# Patient Record
Sex: Female | Born: 1945 | Race: White | Hispanic: No | Marital: Married | Smoking: Former smoker
Health system: Southern US, Community
[De-identification: ages and names within clinical notes are randomized; demographics above are authoritative.]

## PROBLEM LIST (undated history)

## (undated) DIAGNOSIS — E785 Hyperlipidemia, unspecified: Secondary | ICD-10-CM

## (undated) DIAGNOSIS — I10 Essential (primary) hypertension: Secondary | ICD-10-CM

## (undated) HISTORY — PX: COLONOSCOPY: SHX174

## (undated) HISTORY — DX: Essential (primary) hypertension: I10

## (undated) HISTORY — PX: ECTOPIC PREGNANCY SURGERY: SHX613

## (undated) HISTORY — DX: Hyperlipidemia, unspecified: E78.5

## (undated) HISTORY — PX: BREAST REDUCTION SURGERY: SHX8

## (undated) HISTORY — PX: TOTAL ABDOMINAL HYSTERECTOMY: SHX209

---

## 1997-09-30 ENCOUNTER — Other Ambulatory Visit: Admission: RE | Admit: 1997-09-30 | Discharge: 1997-09-30 | Payer: Self-pay | Admitting: Obstetrics and Gynecology

## 1998-10-25 ENCOUNTER — Other Ambulatory Visit: Admission: RE | Admit: 1998-10-25 | Discharge: 1998-10-25 | Payer: Self-pay | Admitting: Obstetrics and Gynecology

## 1999-10-29 ENCOUNTER — Other Ambulatory Visit: Admission: RE | Admit: 1999-10-29 | Discharge: 1999-10-29 | Payer: Self-pay | Admitting: Obstetrics and Gynecology

## 2000-06-25 ENCOUNTER — Encounter (INDEPENDENT_AMBULATORY_CARE_PROVIDER_SITE_OTHER): Payer: Self-pay

## 2000-06-25 ENCOUNTER — Other Ambulatory Visit: Admission: RE | Admit: 2000-06-25 | Discharge: 2000-06-25 | Payer: Self-pay | Admitting: Obstetrics and Gynecology

## 2000-11-04 ENCOUNTER — Other Ambulatory Visit: Admission: RE | Admit: 2000-11-04 | Discharge: 2000-11-04 | Payer: Self-pay | Admitting: Obstetrics and Gynecology

## 2001-04-23 ENCOUNTER — Encounter (INDEPENDENT_AMBULATORY_CARE_PROVIDER_SITE_OTHER): Payer: Self-pay

## 2001-04-23 ENCOUNTER — Ambulatory Visit (HOSPITAL_COMMUNITY): Admission: RE | Admit: 2001-04-23 | Discharge: 2001-04-23 | Payer: Self-pay | Admitting: Obstetrics and Gynecology

## 2001-06-10 ENCOUNTER — Encounter (INDEPENDENT_AMBULATORY_CARE_PROVIDER_SITE_OTHER): Payer: Self-pay | Admitting: Specialist

## 2001-06-10 ENCOUNTER — Encounter: Payer: Self-pay | Admitting: Gastroenterology

## 2001-06-10 ENCOUNTER — Ambulatory Visit (HOSPITAL_COMMUNITY): Admission: RE | Admit: 2001-06-10 | Discharge: 2001-06-10 | Payer: Self-pay | Admitting: Gastroenterology

## 2001-11-06 ENCOUNTER — Other Ambulatory Visit: Admission: RE | Admit: 2001-11-06 | Discharge: 2001-11-06 | Payer: Self-pay | Admitting: Obstetrics and Gynecology

## 2002-07-16 ENCOUNTER — Encounter: Payer: Self-pay | Admitting: Obstetrics and Gynecology

## 2002-07-16 ENCOUNTER — Encounter: Payer: Self-pay | Admitting: Internal Medicine

## 2002-07-20 ENCOUNTER — Inpatient Hospital Stay (HOSPITAL_COMMUNITY): Admission: RE | Admit: 2002-07-20 | Discharge: 2002-07-23 | Payer: Self-pay | Admitting: Obstetrics and Gynecology

## 2002-07-20 ENCOUNTER — Encounter (INDEPENDENT_AMBULATORY_CARE_PROVIDER_SITE_OTHER): Payer: Self-pay | Admitting: Specialist

## 2003-03-14 ENCOUNTER — Other Ambulatory Visit: Admission: RE | Admit: 2003-03-14 | Discharge: 2003-03-14 | Payer: Self-pay | Admitting: Obstetrics and Gynecology

## 2003-10-03 ENCOUNTER — Inpatient Hospital Stay (HOSPITAL_BASED_OUTPATIENT_CLINIC_OR_DEPARTMENT_OTHER): Admission: RE | Admit: 2003-10-03 | Discharge: 2003-10-03 | Payer: Self-pay | Admitting: Cardiology

## 2003-10-10 ENCOUNTER — Encounter: Admission: RE | Admit: 2003-10-10 | Discharge: 2004-01-08 | Payer: Self-pay | Admitting: Internal Medicine

## 2003-12-19 ENCOUNTER — Ambulatory Visit: Payer: Self-pay | Admitting: Internal Medicine

## 2004-01-03 ENCOUNTER — Ambulatory Visit: Payer: Self-pay | Admitting: Internal Medicine

## 2004-03-16 ENCOUNTER — Ambulatory Visit: Payer: Self-pay | Admitting: Internal Medicine

## 2004-04-10 ENCOUNTER — Ambulatory Visit: Payer: Self-pay | Admitting: Internal Medicine

## 2004-04-11 ENCOUNTER — Ambulatory Visit: Payer: Self-pay | Admitting: Internal Medicine

## 2004-07-05 ENCOUNTER — Ambulatory Visit: Payer: Self-pay | Admitting: Internal Medicine

## 2004-07-11 ENCOUNTER — Ambulatory Visit: Payer: Self-pay | Admitting: Internal Medicine

## 2004-08-29 ENCOUNTER — Ambulatory Visit: Payer: Self-pay | Admitting: Endocrinology

## 2004-09-14 ENCOUNTER — Ambulatory Visit: Payer: Self-pay | Admitting: Gastroenterology

## 2004-10-03 ENCOUNTER — Ambulatory Visit: Payer: Self-pay | Admitting: Internal Medicine

## 2004-10-10 ENCOUNTER — Ambulatory Visit: Payer: Self-pay | Admitting: Internal Medicine

## 2004-10-23 ENCOUNTER — Ambulatory Visit: Payer: Self-pay | Admitting: Gastroenterology

## 2004-10-25 ENCOUNTER — Ambulatory Visit: Payer: Self-pay | Admitting: Internal Medicine

## 2004-12-10 ENCOUNTER — Ambulatory Visit: Payer: Self-pay | Admitting: Internal Medicine

## 2005-01-08 ENCOUNTER — Ambulatory Visit: Payer: Self-pay | Admitting: Internal Medicine

## 2005-06-10 ENCOUNTER — Ambulatory Visit: Payer: Self-pay | Admitting: Internal Medicine

## 2005-06-18 ENCOUNTER — Ambulatory Visit: Payer: Self-pay | Admitting: Internal Medicine

## 2005-07-01 ENCOUNTER — Ambulatory Visit: Payer: Self-pay | Admitting: Endocrinology

## 2005-08-22 ENCOUNTER — Ambulatory Visit: Payer: Self-pay | Admitting: Internal Medicine

## 2005-08-28 ENCOUNTER — Ambulatory Visit: Payer: Self-pay | Admitting: Internal Medicine

## 2005-09-20 ENCOUNTER — Ambulatory Visit: Payer: Self-pay | Admitting: Internal Medicine

## 2005-09-26 ENCOUNTER — Ambulatory Visit: Payer: Self-pay | Admitting: Internal Medicine

## 2005-10-21 ENCOUNTER — Ambulatory Visit: Payer: Self-pay | Admitting: Internal Medicine

## 2005-10-24 ENCOUNTER — Ambulatory Visit: Payer: Self-pay | Admitting: Gastroenterology

## 2005-12-10 ENCOUNTER — Ambulatory Visit (HOSPITAL_BASED_OUTPATIENT_CLINIC_OR_DEPARTMENT_OTHER): Admission: RE | Admit: 2005-12-10 | Discharge: 2005-12-11 | Payer: Self-pay | Admitting: Plastic Surgery

## 2005-12-10 ENCOUNTER — Encounter (INDEPENDENT_AMBULATORY_CARE_PROVIDER_SITE_OTHER): Payer: Self-pay | Admitting: *Deleted

## 2005-12-25 ENCOUNTER — Ambulatory Visit: Payer: Self-pay | Admitting: Internal Medicine

## 2005-12-25 LAB — CONVERTED CEMR LAB
Alkaline Phosphatase: 61 units/L (ref 39–117)
BUN: 11 mg/dL (ref 6–23)
CO2: 30 meq/L (ref 19–32)
Calcium: 9.8 mg/dL (ref 8.4–10.5)
Chloride: 106 meq/L (ref 96–112)
Chol/HDL Ratio, serum: 5
Creatinine, Ser: 0.8 mg/dL (ref 0.4–1.2)
GFR calc non Af Amer: 78 mL/min
Glucose, Bld: 114 mg/dL — ABNORMAL HIGH (ref 70–99)
HDL: 45 mg/dL (ref >39.0)
Hgb A1c MFr Bld: 5.1 % (ref 4.6–6.0)
LDL DIRECT: 121.2 mg/dL
Triglyceride fasting, serum: 333 mg/dL (ref 0–149)

## 2005-12-27 ENCOUNTER — Ambulatory Visit: Payer: Self-pay | Admitting: Internal Medicine

## 2006-01-07 ENCOUNTER — Ambulatory Visit: Payer: Self-pay | Admitting: Internal Medicine

## 2006-02-03 ENCOUNTER — Emergency Department (HOSPITAL_COMMUNITY): Admission: EM | Admit: 2006-02-03 | Discharge: 2006-02-03 | Payer: Self-pay | Admitting: Family Medicine

## 2006-03-15 ENCOUNTER — Ambulatory Visit: Payer: Self-pay | Admitting: Family Medicine

## 2006-04-09 ENCOUNTER — Emergency Department (HOSPITAL_COMMUNITY): Admission: EM | Admit: 2006-04-09 | Discharge: 2006-04-09 | Payer: Self-pay | Admitting: Emergency Medicine

## 2006-04-15 ENCOUNTER — Ambulatory Visit: Payer: Self-pay | Admitting: Internal Medicine

## 2006-04-15 LAB — CONVERTED CEMR LAB
Cholesterol: 211 mg/dL (ref 0–200)
Direct LDL: 116.4 mg/dL
Glucose, Bld: 109 mg/dL — ABNORMAL HIGH (ref 70–99)
HDL: 40.3 mg/dL (ref >39.0)
Hgb A1c MFr Bld: 6 % (ref 4.6–6.0)
Triglycerides: 398 mg/dL (ref 0–149)

## 2006-04-16 ENCOUNTER — Ambulatory Visit: Payer: Self-pay | Admitting: Internal Medicine

## 2006-05-16 ENCOUNTER — Emergency Department (HOSPITAL_COMMUNITY): Admission: EM | Admit: 2006-05-16 | Discharge: 2006-05-16 | Payer: Self-pay | Admitting: Family Medicine

## 2006-07-20 ENCOUNTER — Emergency Department (HOSPITAL_COMMUNITY): Admission: EM | Admit: 2006-07-20 | Discharge: 2006-07-20 | Payer: Self-pay | Admitting: Emergency Medicine

## 2006-08-19 ENCOUNTER — Ambulatory Visit: Payer: Self-pay | Admitting: Internal Medicine

## 2006-08-19 LAB — CONVERTED CEMR LAB
BUN: 13 mg/dL (ref 6–23)
Calcium: 9.8 mg/dL (ref 8.4–10.5)
Chloride: 107 meq/L (ref 96–112)
Creatinine, Ser: 0.7 mg/dL (ref 0.4–1.2)
GFR calc non Af Amer: 91 mL/min
Hgb A1c MFr Bld: 5.7 % (ref 4.6–6.0)
Sodium: 142 meq/L (ref 135–145)

## 2006-08-22 ENCOUNTER — Ambulatory Visit: Payer: Self-pay | Admitting: Internal Medicine

## 2006-08-22 LAB — CONVERTED CEMR LAB
Hemoglobin, Urine: NEGATIVE
Ketones, ur: NEGATIVE mg/dL
Leukocytes, UA: NEGATIVE
Specific Gravity, Urine: 1.01 (ref 1.000–1.03)
Total Protein, Urine: NEGATIVE mg/dL
Urobilinogen, UA: 0.2 (ref 0.0–1.0)
pH: 7 (ref 5.0–8.0)

## 2006-09-08 ENCOUNTER — Encounter: Payer: Self-pay | Admitting: Internal Medicine

## 2006-09-08 DIAGNOSIS — E119 Type 2 diabetes mellitus without complications: Secondary | ICD-10-CM

## 2006-09-08 DIAGNOSIS — D259 Leiomyoma of uterus, unspecified: Secondary | ICD-10-CM | POA: Insufficient documentation

## 2006-09-08 DIAGNOSIS — E785 Hyperlipidemia, unspecified: Secondary | ICD-10-CM | POA: Insufficient documentation

## 2006-09-08 DIAGNOSIS — Z860100 Personal history of colon polyps, unspecified: Secondary | ICD-10-CM | POA: Insufficient documentation

## 2006-09-08 DIAGNOSIS — M26629 Arthralgia of temporomandibular joint, unspecified side: Secondary | ICD-10-CM | POA: Insufficient documentation

## 2006-09-22 ENCOUNTER — Emergency Department (HOSPITAL_COMMUNITY): Admission: EM | Admit: 2006-09-22 | Discharge: 2006-09-22 | Payer: Self-pay | Admitting: Emergency Medicine

## 2006-10-20 ENCOUNTER — Encounter: Admission: RE | Admit: 2006-10-20 | Discharge: 2006-10-20 | Payer: Self-pay | Admitting: Obstetrics and Gynecology

## 2006-12-17 ENCOUNTER — Ambulatory Visit: Payer: Self-pay | Admitting: Cardiology

## 2006-12-17 LAB — CONVERTED CEMR LAB
BUN: 12 mg/dL (ref 6–23)
Basophils Absolute: 0 10*3/uL (ref 0.0–0.1)
Basophils Relative: 0 % (ref 0.0–1.0)
CO2: 29 meq/L (ref 19–32)
Chloride: 105 meq/L (ref 96–112)
Creatinine, Ser: 0.7 mg/dL (ref 0.4–1.2)
HCT: 41.1 % (ref 36.0–46.0)
Iron: 73 ug/dL (ref 42–145)
MCHC: 35.7 g/dL (ref 30.0–36.0)
Monocytes Relative: 3.5 % (ref 3.0–11.0)
Neutrophils Relative %: 71.1 % (ref 43.0–77.0)
RBC: 4.36 M/uL (ref 3.87–5.11)
RDW: 12.2 % (ref 11.5–14.6)

## 2006-12-23 ENCOUNTER — Ambulatory Visit: Payer: Self-pay | Admitting: Internal Medicine

## 2006-12-23 DIAGNOSIS — J309 Allergic rhinitis, unspecified: Secondary | ICD-10-CM | POA: Insufficient documentation

## 2007-01-22 ENCOUNTER — Ambulatory Visit: Payer: Self-pay | Admitting: Cardiology

## 2007-02-06 IMAGING — US US ABDOMEN COMPLETE
1 series · 14 of 25 positions shown · non-contrast
Comparison: none

[Series 1: us abdomen complete · 0.13mm/px · 14 of 66 slices shown]
[im 1/66]
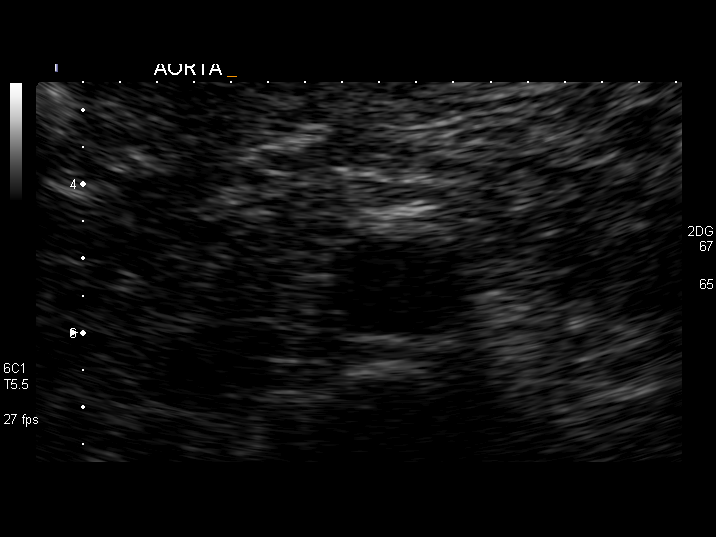
[im 6/66]
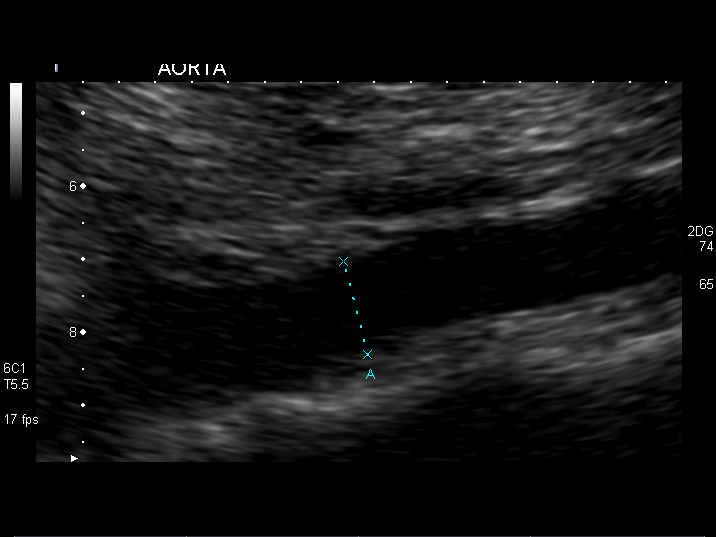
[im 11/66]
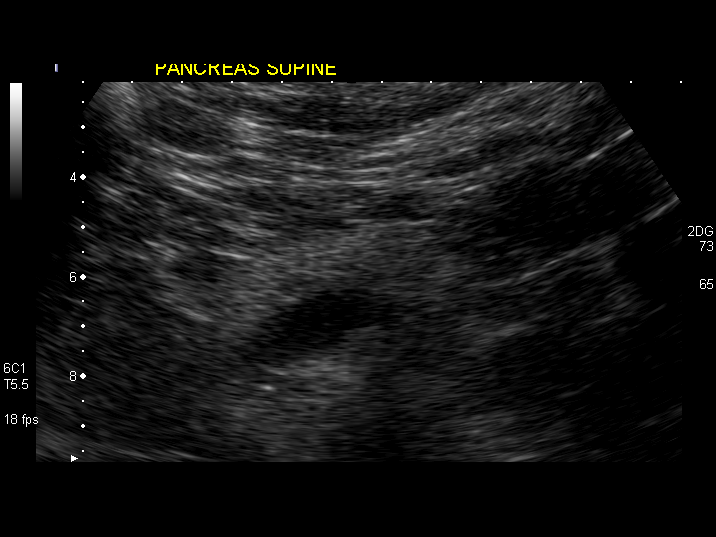
[im 17/66]
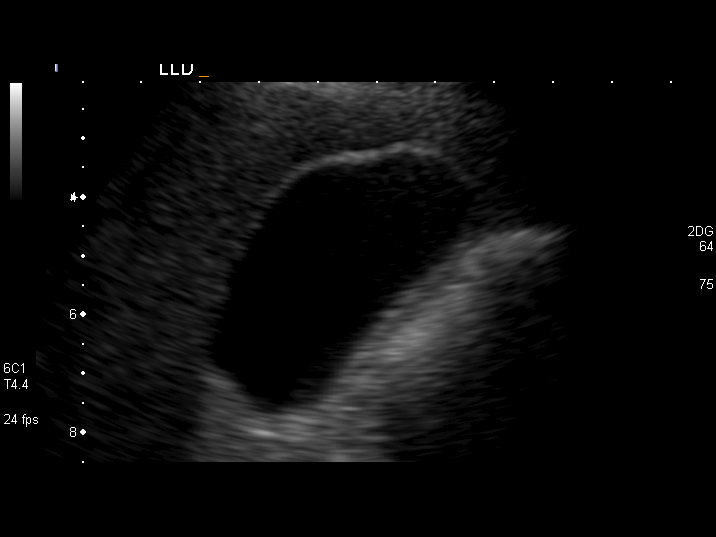
[im 22/66]
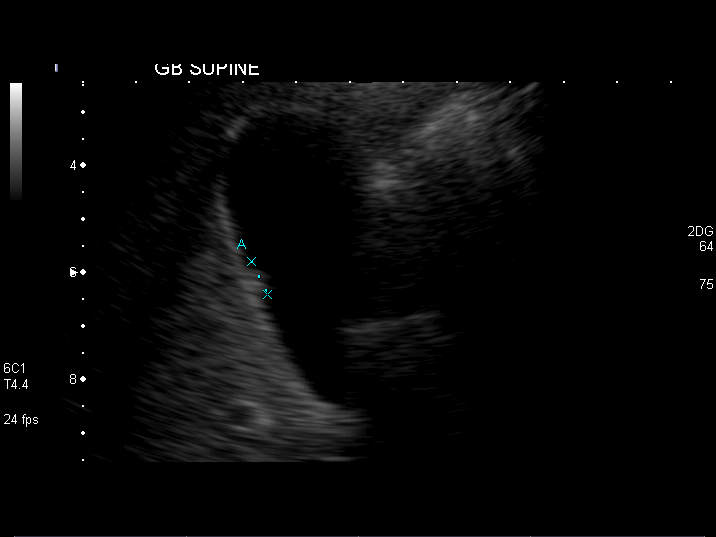
[im 25/66]
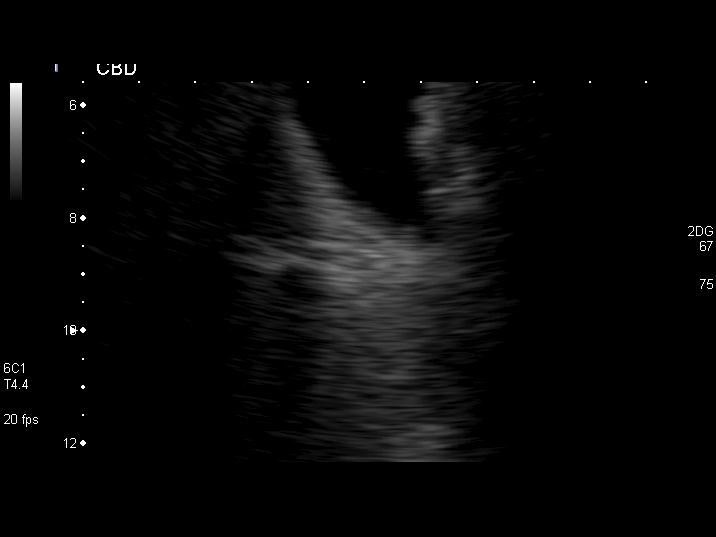
[im 30/66]
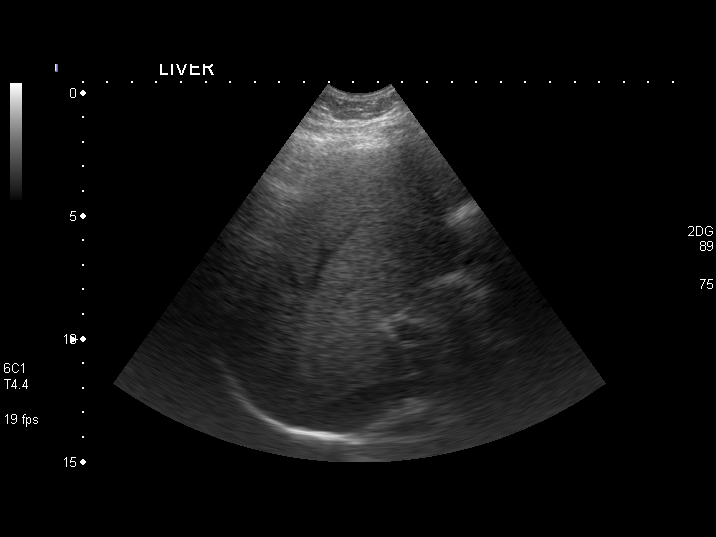
[im 36/66]
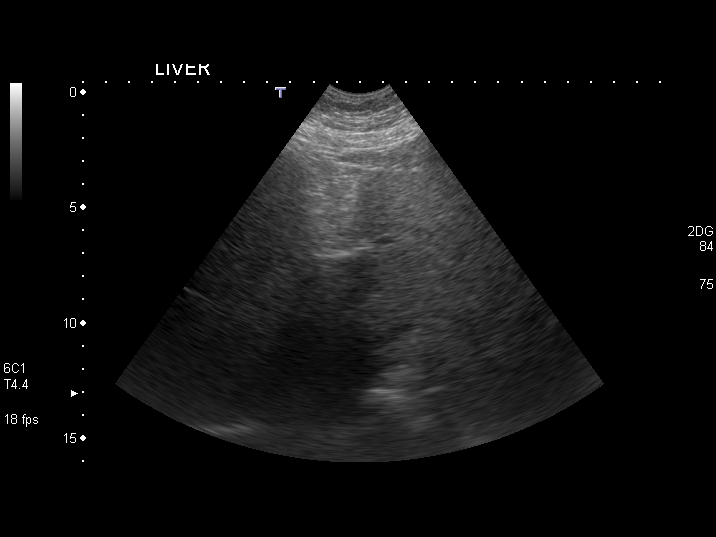
[im 41/66]
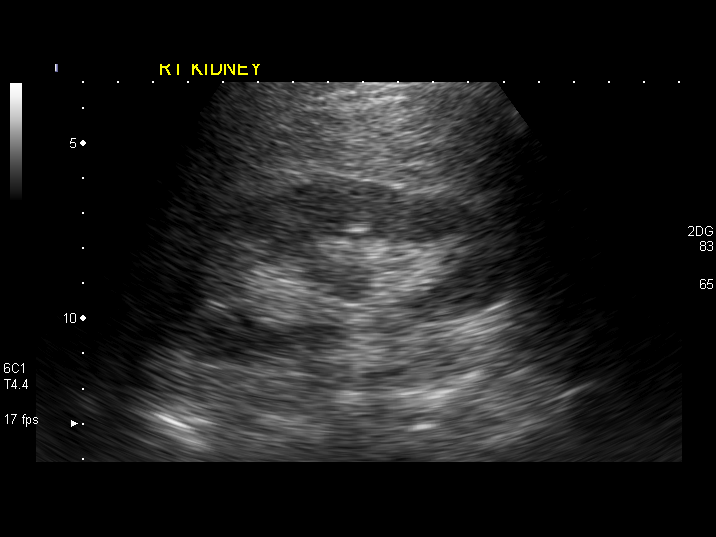
[im 44/66]
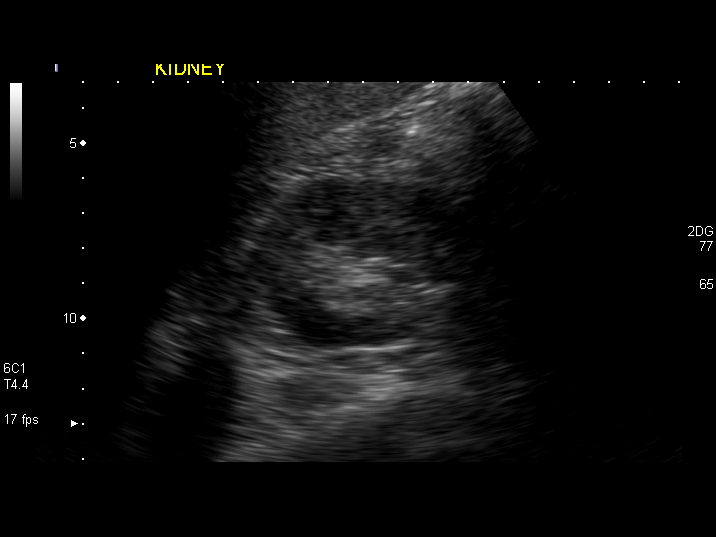
[im 49/66]
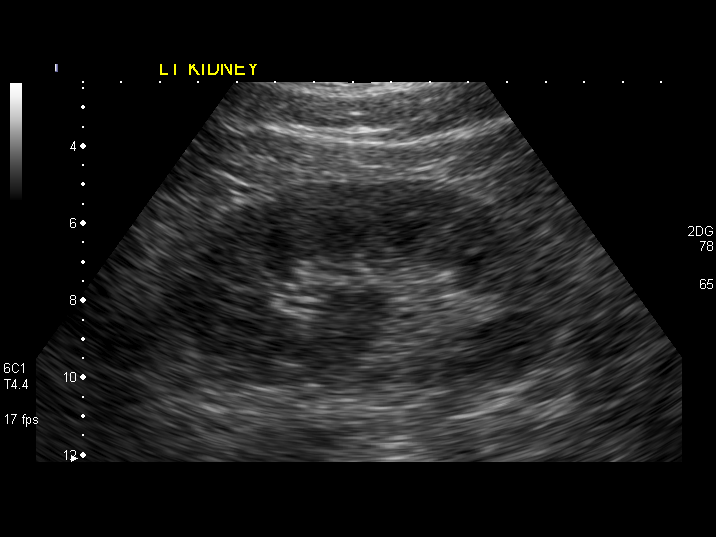
[im 55/66]
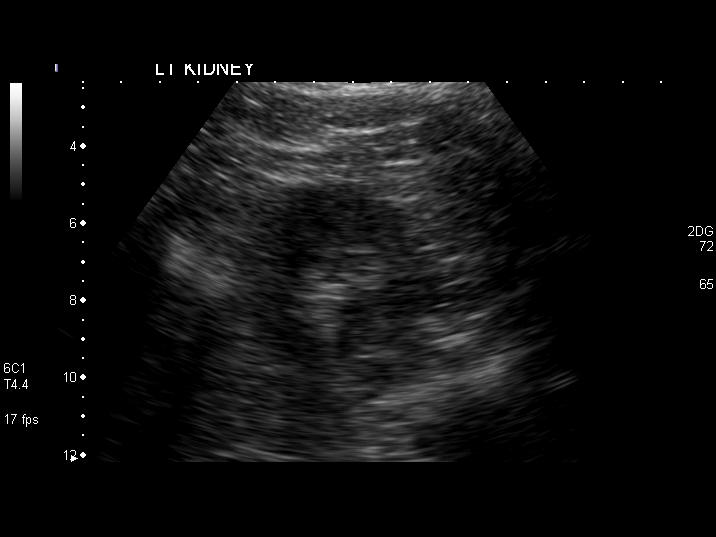
[im 60/66]
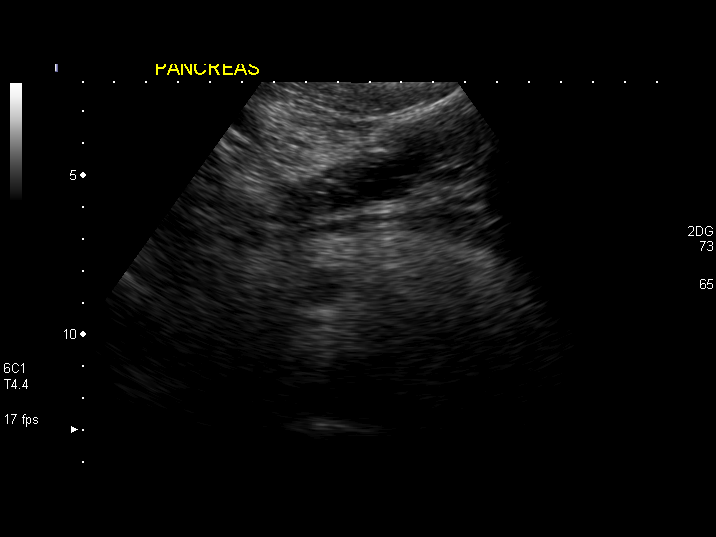
[im 66/66]
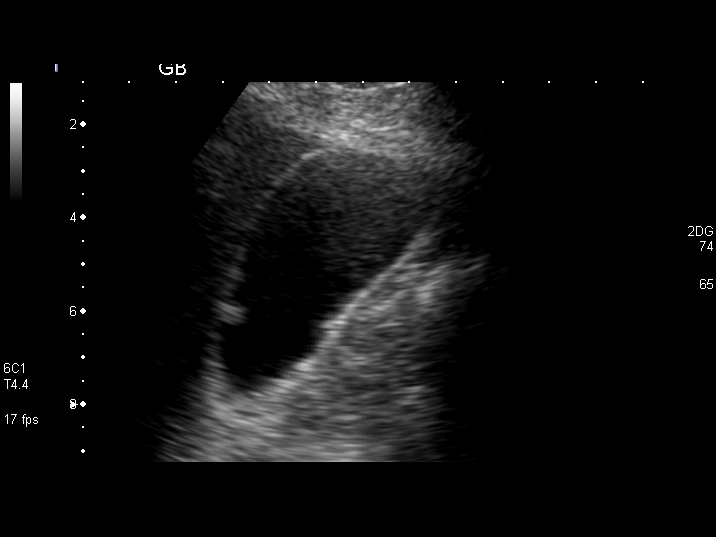

[14 of 25 positions shown; findings below may reference images not displayed]

ACCESSION # [id number]

 RESULTS:  Abdominal aorta is normal, maximal diameter 1.7 cm. The IVC is patent. 

 Pancreas normal. 

 Gallbladder generally appears normal without thickened walls or evidence of gallstones. There are several small 5-7 mm non shadowing stable polypoid lesions noted. Common duct normal at 3.8 mm. 

 Liver normal. 

 Kidneys normal, right 9.5, left 10.7 cm. Spleen normal at 10.7 cm.

 ASSESSMENT:  This is a normal upper abdominal ultrasound exam except for the small gallbladder polyps. There is no evidence of cholelithiasis or biliary ductal dilatation. The pancreas, kidneys and liver appear normal without abnormalities.

## 2007-02-15 ENCOUNTER — Emergency Department (HOSPITAL_COMMUNITY): Admission: EM | Admit: 2007-02-15 | Discharge: 2007-02-15 | Payer: Self-pay | Admitting: Emergency Medicine

## 2007-04-03 ENCOUNTER — Ambulatory Visit: Payer: Self-pay | Admitting: Internal Medicine

## 2007-04-03 LAB — CONVERTED CEMR LAB
Albumin: 4 g/dL (ref 3.5–5.2)
Alkaline Phosphatase: 55 units/L (ref 39–117)
BUN: 11 mg/dL (ref 6–23)
Basophils Absolute: 0 10*3/uL (ref 0.0–0.1)
Calcium: 10.1 mg/dL (ref 8.4–10.5)
Chloride: 108 meq/L (ref 96–112)
Cholesterol: 205 mg/dL (ref 0–200)
Creatinine, Ser: 0.8 mg/dL (ref 0.4–1.2)
Direct LDL: 128 mg/dL
HDL: 40.1 mg/dL (ref >39.0)
MCHC: 33.4 g/dL (ref 30.0–36.0)
Monocytes Absolute: 0.4 10*3/uL (ref 0.2–0.7)
Monocytes Relative: 8.2 % (ref 3.0–11.0)
Platelets: 283 10*3/uL (ref 150–400)
Potassium: 4.9 meq/L (ref 3.5–5.1)
RBC: 4.74 M/uL (ref 3.87–5.11)
RDW: 12.5 % (ref 11.5–14.6)
Total Bilirubin: 0.9 mg/dL (ref 0.3–1.2)
Total CHOL/HDL Ratio: 5.1
Triglycerides: 235 mg/dL (ref 0–149)

## 2007-04-09 ENCOUNTER — Emergency Department (HOSPITAL_COMMUNITY): Admission: EM | Admit: 2007-04-09 | Discharge: 2007-04-09 | Payer: Self-pay | Admitting: Family Medicine

## 2007-04-22 ENCOUNTER — Ambulatory Visit: Payer: Self-pay | Admitting: Internal Medicine

## 2007-06-18 ENCOUNTER — Emergency Department (HOSPITAL_COMMUNITY): Admission: EM | Admit: 2007-06-18 | Discharge: 2007-06-18 | Payer: Self-pay | Admitting: Emergency Medicine

## 2007-07-02 ENCOUNTER — Ambulatory Visit: Payer: Self-pay | Admitting: Internal Medicine

## 2007-07-02 DIAGNOSIS — J019 Acute sinusitis, unspecified: Secondary | ICD-10-CM

## 2007-08-19 ENCOUNTER — Ambulatory Visit: Payer: Self-pay | Admitting: Internal Medicine

## 2007-08-19 LAB — CONVERTED CEMR LAB
Albumin: 4.1 g/dL (ref 3.5–5.2)
BUN: 12 mg/dL (ref 6–23)
Bilirubin, Direct: 0.1 mg/dL (ref 0.0–0.3)
Calcium: 9.7 mg/dL (ref 8.4–10.5)
Direct LDL: 115 mg/dL
GFR calc Af Amer: 94 mL/min
Glucose, Bld: 122 mg/dL — ABNORMAL HIGH (ref 70–99)
HDL: 40.6 mg/dL (ref >39.0)
Sodium: 142 meq/L (ref 135–145)
Total Protein: 7.1 g/dL (ref 6.0–8.3)
Triglycerides: 202 mg/dL (ref 0–149)

## 2007-08-25 ENCOUNTER — Ambulatory Visit: Payer: Self-pay | Admitting: Internal Medicine

## 2007-08-25 DIAGNOSIS — IMO0001 Reserved for inherently not codable concepts without codable children: Secondary | ICD-10-CM | POA: Insufficient documentation

## 2007-10-21 ENCOUNTER — Encounter: Admission: RE | Admit: 2007-10-21 | Discharge: 2007-10-21 | Payer: Self-pay | Admitting: Internal Medicine

## 2007-10-23 ENCOUNTER — Encounter: Admission: RE | Admit: 2007-10-23 | Discharge: 2007-10-23 | Payer: Self-pay | Admitting: Internal Medicine

## 2007-10-28 ENCOUNTER — Ambulatory Visit: Payer: Self-pay | Admitting: Family Medicine

## 2007-12-10 ENCOUNTER — Ambulatory Visit: Payer: Self-pay | Admitting: Internal Medicine

## 2007-12-10 DIAGNOSIS — L923 Foreign body granuloma of the skin and subcutaneous tissue: Secondary | ICD-10-CM | POA: Insufficient documentation

## 2007-12-17 ENCOUNTER — Encounter: Payer: Self-pay | Admitting: Internal Medicine

## 2007-12-21 ENCOUNTER — Encounter: Admission: RE | Admit: 2007-12-21 | Discharge: 2007-12-21 | Payer: Self-pay | Admitting: Orthopedic Surgery

## 2007-12-22 ENCOUNTER — Ambulatory Visit: Payer: Self-pay | Admitting: Internal Medicine

## 2007-12-22 LAB — CONVERTED CEMR LAB
ALT: 22 units/L (ref 0–35)
BUN: 12 mg/dL (ref 6–23)
CO2: 28 meq/L (ref 19–32)
Calcium: 9.8 mg/dL (ref 8.4–10.5)
Cholesterol: 273 mg/dL (ref 0–200)
Creatinine, Ser: 0.7 mg/dL (ref 0.4–1.2)
Direct LDL: 142.9 mg/dL
Glucose, Bld: 118 mg/dL — ABNORMAL HIGH (ref 70–99)
Hgb A1c MFr Bld: 5.7 % (ref 4.6–6.0)
Total Protein: 6.8 g/dL (ref 6.0–8.3)
Triglycerides: 425 mg/dL (ref 0–149)

## 2007-12-29 ENCOUNTER — Encounter: Payer: Self-pay | Admitting: Internal Medicine

## 2008-01-14 ENCOUNTER — Encounter (INDEPENDENT_AMBULATORY_CARE_PROVIDER_SITE_OTHER): Payer: Self-pay | Admitting: Orthopedic Surgery

## 2008-01-14 ENCOUNTER — Ambulatory Visit (HOSPITAL_BASED_OUTPATIENT_CLINIC_OR_DEPARTMENT_OTHER): Admission: RE | Admit: 2008-01-14 | Discharge: 2008-01-14 | Payer: Self-pay | Admitting: Orthopedic Surgery

## 2008-01-22 ENCOUNTER — Encounter: Payer: Self-pay | Admitting: Internal Medicine

## 2008-01-28 ENCOUNTER — Encounter: Payer: Self-pay | Admitting: Internal Medicine

## 2008-02-23 ENCOUNTER — Encounter: Payer: Self-pay | Admitting: Internal Medicine

## 2008-02-24 ENCOUNTER — Emergency Department (HOSPITAL_COMMUNITY): Admission: EM | Admit: 2008-02-24 | Discharge: 2008-02-24 | Payer: Self-pay | Admitting: Family Medicine

## 2008-04-05 ENCOUNTER — Telehealth (INDEPENDENT_AMBULATORY_CARE_PROVIDER_SITE_OTHER): Payer: Self-pay | Admitting: *Deleted

## 2008-04-18 ENCOUNTER — Ambulatory Visit: Payer: Self-pay | Admitting: Internal Medicine

## 2008-04-18 LAB — CONVERTED CEMR LAB
Alkaline Phosphatase: 62 units/L (ref 39–117)
Bilirubin, Direct: 0.1 mg/dL (ref 0.0–0.3)
CO2: 28 meq/L (ref 19–32)
GFR calc Af Amer: 93 mL/min
Glucose, Bld: 126 mg/dL — ABNORMAL HIGH (ref 70–99)
Potassium: 4.1 meq/L (ref 3.5–5.1)
Sodium: 143 meq/L (ref 135–145)
Total Bilirubin: 0.9 mg/dL (ref 0.3–1.2)
Total CHOL/HDL Ratio: 4.5
Total Protein: 6.3 g/dL (ref 6.0–8.3)
VLDL: 38 mg/dL (ref 0–40)

## 2008-04-21 ENCOUNTER — Emergency Department (HOSPITAL_COMMUNITY): Admission: EM | Admit: 2008-04-21 | Discharge: 2008-04-21 | Payer: Self-pay | Admitting: Family Medicine

## 2008-04-25 ENCOUNTER — Ambulatory Visit: Payer: Self-pay | Admitting: Internal Medicine

## 2008-05-24 ENCOUNTER — Encounter: Admission: RE | Admit: 2008-05-24 | Discharge: 2008-05-24 | Payer: Self-pay | Admitting: Internal Medicine

## 2008-06-01 ENCOUNTER — Telehealth: Payer: Self-pay | Admitting: Internal Medicine

## 2008-07-20 ENCOUNTER — Emergency Department (HOSPITAL_COMMUNITY): Admission: EM | Admit: 2008-07-20 | Discharge: 2008-07-20 | Payer: Self-pay | Admitting: Family Medicine

## 2008-08-16 ENCOUNTER — Ambulatory Visit: Payer: Self-pay | Admitting: Internal Medicine

## 2008-08-17 LAB — CONVERTED CEMR LAB
CO2: 30 meq/L (ref 19–32)
Calcium: 9.6 mg/dL (ref 8.4–10.5)
Chloride: 105 meq/L (ref 96–112)
Direct LDL: 111.7 mg/dL
Glucose, Bld: 117 mg/dL — ABNORMAL HIGH (ref 70–99)
HDL: 41.4 mg/dL (ref >39.00)
Hgb A1c MFr Bld: 5.8 % (ref 4.6–6.5)
Sodium: 142 meq/L (ref 135–145)
Total CHOL/HDL Ratio: 4
Triglycerides: 256 mg/dL — ABNORMAL HIGH (ref 0.0–149.0)
VLDL: 51.2 mg/dL — ABNORMAL HIGH (ref 0.0–40.0)

## 2008-08-23 ENCOUNTER — Ambulatory Visit: Payer: Self-pay | Admitting: Internal Medicine

## 2008-09-12 ENCOUNTER — Emergency Department (HOSPITAL_COMMUNITY): Admission: EM | Admit: 2008-09-12 | Discharge: 2008-09-12 | Payer: Self-pay | Admitting: Emergency Medicine

## 2008-10-21 ENCOUNTER — Encounter: Admission: RE | Admit: 2008-10-21 | Discharge: 2008-10-21 | Payer: Self-pay | Admitting: Internal Medicine

## 2008-10-28 ENCOUNTER — Telehealth: Payer: Self-pay | Admitting: Internal Medicine

## 2008-11-02 ENCOUNTER — Encounter: Payer: Self-pay | Admitting: Internal Medicine

## 2008-11-02 ENCOUNTER — Ambulatory Visit: Payer: Self-pay | Admitting: Internal Medicine

## 2008-11-02 DIAGNOSIS — N390 Urinary tract infection, site not specified: Secondary | ICD-10-CM | POA: Insufficient documentation

## 2008-11-02 DIAGNOSIS — R109 Unspecified abdominal pain: Secondary | ICD-10-CM | POA: Insufficient documentation

## 2008-11-02 LAB — CONVERTED CEMR LAB
Bilirubin Urine: NEGATIVE
Ketones, urine, test strip: NEGATIVE
Nitrite: NEGATIVE
Specific Gravity, Urine: <1.005
Urobilinogen, UA: 0.2
pH: 5

## 2008-11-03 ENCOUNTER — Ambulatory Visit: Payer: Self-pay | Admitting: Cardiology

## 2008-11-03 ENCOUNTER — Telehealth: Payer: Self-pay | Admitting: Internal Medicine

## 2008-11-03 DIAGNOSIS — N816 Rectocele: Secondary | ICD-10-CM

## 2008-11-03 DIAGNOSIS — N8111 Cystocele, midline: Secondary | ICD-10-CM

## 2008-11-18 ENCOUNTER — Ambulatory Visit: Payer: Self-pay | Admitting: Internal Medicine

## 2008-11-18 DIAGNOSIS — I10 Essential (primary) hypertension: Secondary | ICD-10-CM

## 2008-11-18 DIAGNOSIS — R0789 Other chest pain: Secondary | ICD-10-CM

## 2008-12-21 ENCOUNTER — Ambulatory Visit: Payer: Self-pay | Admitting: Internal Medicine

## 2008-12-21 LAB — CONVERTED CEMR LAB
AST: 18 units/L (ref 0–37)
Albumin: 4.1 g/dL (ref 3.5–5.2)
Alkaline Phosphatase: 67 units/L (ref 39–117)
BUN: 11 mg/dL (ref 6–23)
Bilirubin, Direct: 0.1 mg/dL (ref 0.0–0.3)
Calcium: 9.4 mg/dL (ref 8.4–10.5)
Cholesterol: 205 mg/dL — ABNORMAL HIGH (ref 0–200)
Creatinine, Ser: 0.7 mg/dL (ref 0.4–1.2)
GFR calc non Af Amer: 89.89 mL/min (ref >60)
Glucose, Bld: 115 mg/dL — ABNORMAL HIGH (ref 70–99)
Total Protein: 7.2 g/dL (ref 6.0–8.3)

## 2008-12-27 ENCOUNTER — Ambulatory Visit: Payer: Self-pay | Admitting: Internal Medicine

## 2009-01-17 ENCOUNTER — Telehealth: Payer: Self-pay | Admitting: Internal Medicine

## 2009-02-13 ENCOUNTER — Telehealth: Payer: Self-pay | Admitting: Internal Medicine

## 2009-04-04 IMAGING — US US MISC SOFT TISSUE
1 series · 10 of 10 positions shown · non-contrast
Comparison: Radiographs accompanying the patient from Dr. [REDACTED].

CLINICAL DATA: Laceration to the tip of the index finger.  Question
glass foreign body.

SOFT TISSUE ULTRASOUND - MISCELLANEOUS
Ultrasound evaluation of the distal right index finger was
performed.

[Series 1: us misc soft tissue · 0.05mm/px · 10 of 10 slices shown]
[im 1/10]
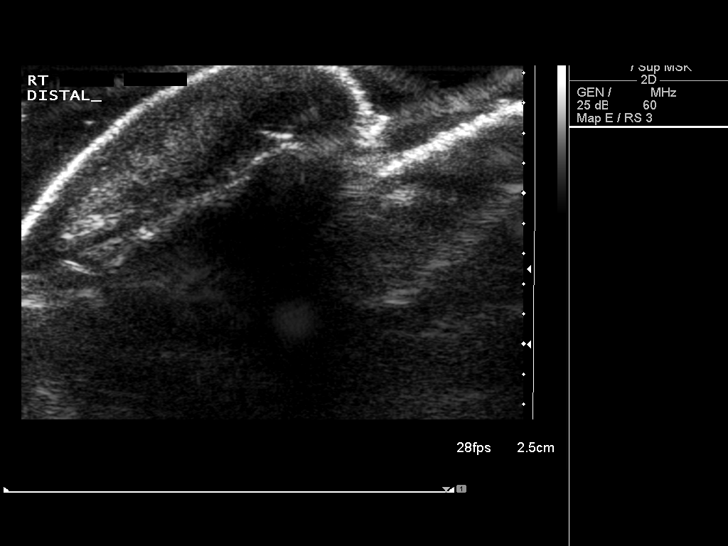
[im 2/10]
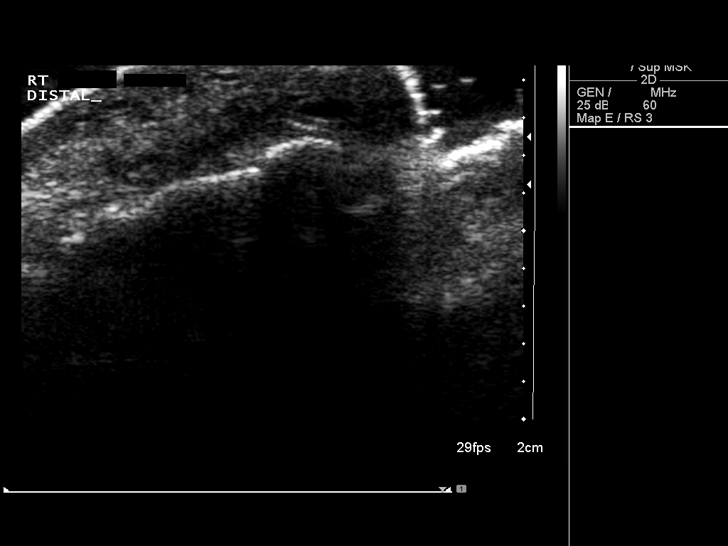
[im 3/10]
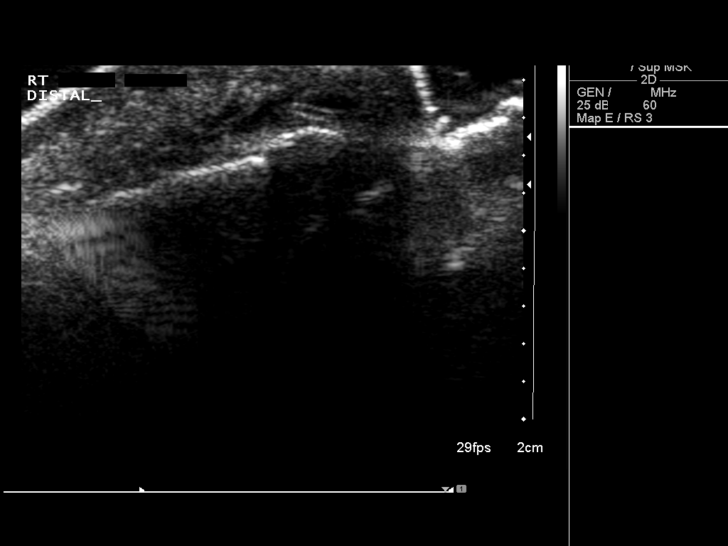
[im 4/10]
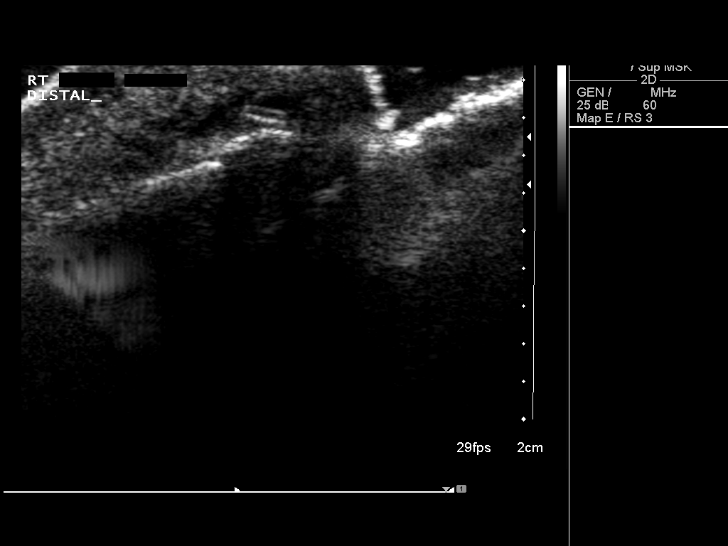
[im 5/10]
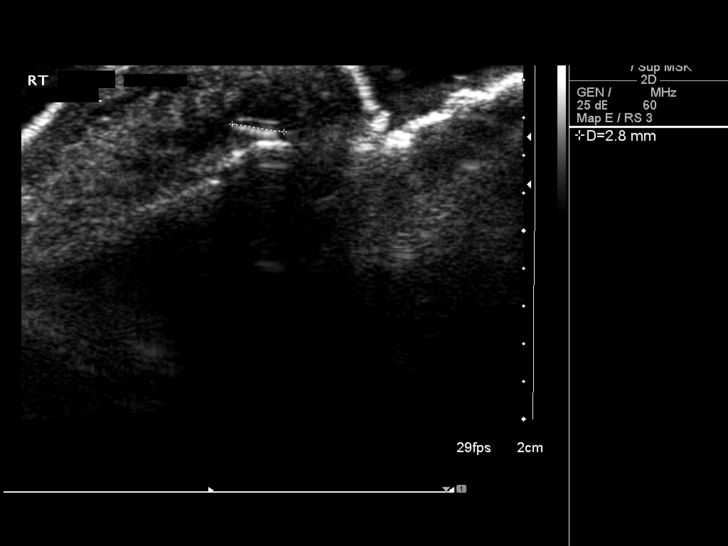
[im 6/10]
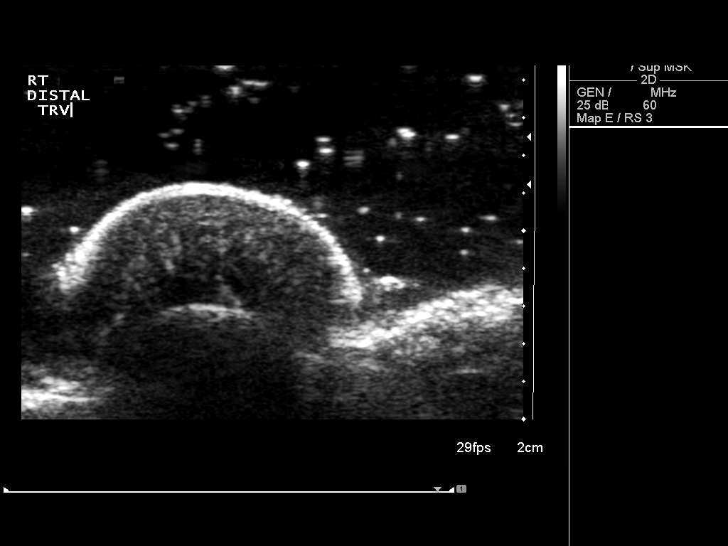
[im 7/10]
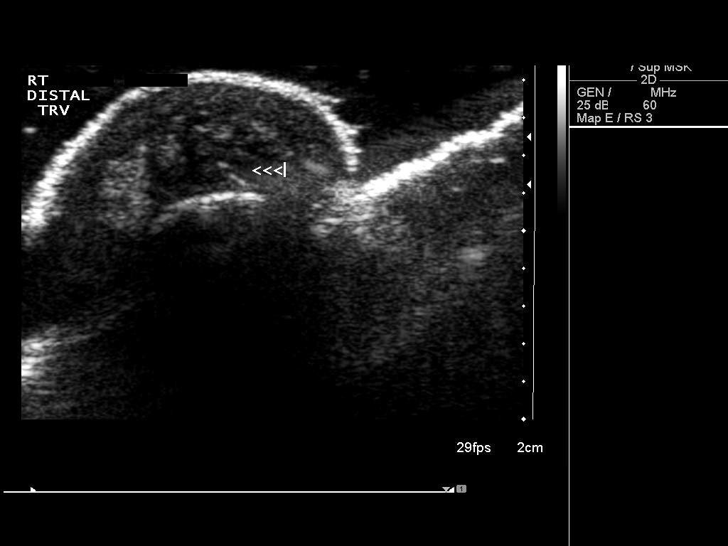
[im 8/10]
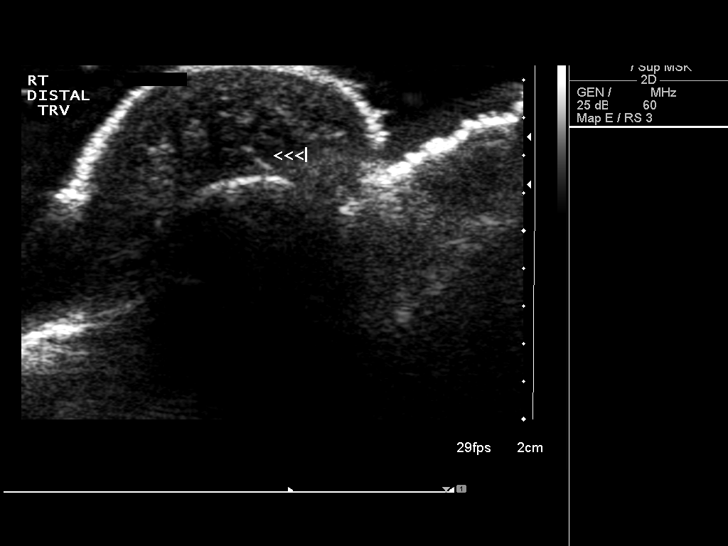
[im 9/10]
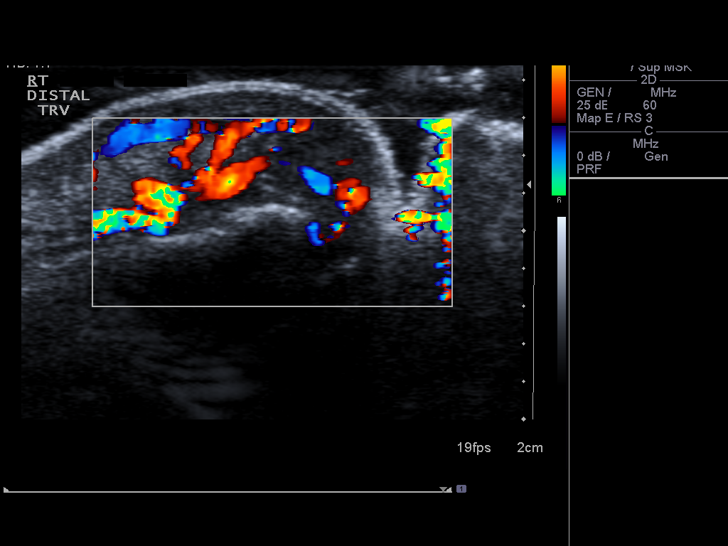
[im 10/10]
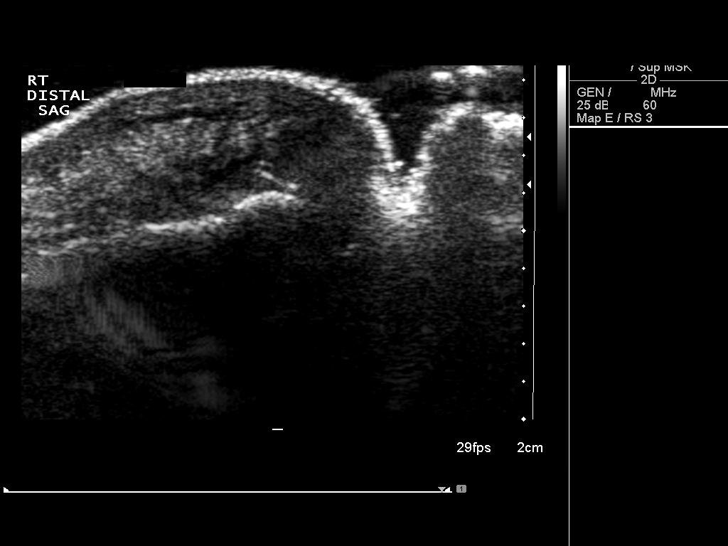

[10 of 10 positions shown; findings below may reference images not displayed]

FINDINGS: There is an echogenic focus along the volar tip of the
distal phalanx of the index finger.  This measures 3 mm in diameter
and is suspicious for a small foreign body. Color Doppler
evaluation of this area demonstrates hyperemia in the surrounding
soft tissues.  There is no focal fluid collection.
IMPRESSION: Findings are suspicious for a small glass fragment in the soft
tissues adjacent to the tip of the distal second phalanx.

## 2009-04-26 ENCOUNTER — Ambulatory Visit: Payer: Self-pay | Admitting: Internal Medicine

## 2009-04-26 LAB — CONVERTED CEMR LAB
BUN: 15 mg/dL (ref 6–23)
CO2: 28 meq/L (ref 19–32)
Calcium: 9.3 mg/dL (ref 8.4–10.5)
GFR calc non Af Amer: 76.97 mL/min (ref >60)
Glucose, Bld: 110 mg/dL — ABNORMAL HIGH (ref 70–99)
Potassium: 3.9 meq/L (ref 3.5–5.1)
Sodium: 143 meq/L (ref 135–145)

## 2009-07-13 ENCOUNTER — Emergency Department (HOSPITAL_COMMUNITY): Admission: EM | Admit: 2009-07-13 | Discharge: 2009-07-13 | Payer: Self-pay | Admitting: Family Medicine

## 2009-08-09 ENCOUNTER — Telehealth: Payer: Self-pay | Admitting: Internal Medicine

## 2009-09-04 ENCOUNTER — Telehealth (INDEPENDENT_AMBULATORY_CARE_PROVIDER_SITE_OTHER): Payer: Self-pay | Admitting: *Deleted

## 2009-09-11 ENCOUNTER — Encounter: Payer: Self-pay | Admitting: Cardiovascular Disease

## 2009-09-11 ENCOUNTER — Telehealth: Payer: Self-pay | Admitting: Internal Medicine

## 2009-09-11 ENCOUNTER — Ambulatory Visit: Payer: Self-pay | Admitting: Internal Medicine

## 2009-09-11 LAB — CONVERTED CEMR LAB
AST: 24 units/L (ref 0–37)
Albumin: 4.3 g/dL (ref 3.5–5.2)
Alkaline Phosphatase: 60 units/L (ref 39–117)
Bilirubin, Direct: 0.1 mg/dL (ref 0.0–0.3)
Hgb A1c MFr Bld: 5.8 % (ref 4.6–6.5)
Vit D, 25-Hydroxy: 51 ng/mL (ref 30–89)

## 2009-09-22 ENCOUNTER — Ambulatory Visit: Payer: Self-pay | Admitting: Internal Medicine

## 2009-09-22 DIAGNOSIS — M542 Cervicalgia: Secondary | ICD-10-CM

## 2009-10-23 ENCOUNTER — Encounter: Admission: RE | Admit: 2009-10-23 | Discharge: 2009-10-23 | Payer: Self-pay | Admitting: Internal Medicine

## 2009-11-01 ENCOUNTER — Encounter: Payer: Self-pay | Admitting: Gastroenterology

## 2009-11-03 ENCOUNTER — Emergency Department (HOSPITAL_COMMUNITY): Admission: EM | Admit: 2009-11-03 | Discharge: 2009-11-03 | Payer: Self-pay | Admitting: Family Medicine

## 2010-01-16 ENCOUNTER — Ambulatory Visit: Payer: Self-pay | Admitting: Internal Medicine

## 2010-02-07 LAB — CONVERTED CEMR LAB
Alkaline Phosphatase: 64 units/L (ref 39–117)
BUN: 12 mg/dL (ref 6–23)
Basophils Absolute: 0 10*3/uL (ref 0.0–0.1)
Bilirubin, Direct: 0 mg/dL (ref 0.0–0.3)
Blood, UA: NEGATIVE
CO2: 28 meq/L (ref 19–32)
Calcium: 9.7 mg/dL (ref 8.4–10.5)
Chloride: 105 meq/L (ref 96–112)
Cholesterol: 204 mg/dL — ABNORMAL HIGH (ref 0–200)
Creatinine, Ser: 0.7 mg/dL (ref 0.4–1.2)
Eosinophils Absolute: 0.2 10*3/uL (ref 0.0–0.7)
HDL: 42.8 mg/dL (ref >39.00)
Ketones, ur: NEGATIVE mg/dL
Lymphocytes Relative: 24.1 % (ref 12.0–46.0)
MCHC: 34.7 g/dL (ref 30.0–36.0)
MCV: 95 fL (ref 78.0–100.0)
Monocytes Absolute: 0.5 10*3/uL (ref 0.1–1.0)
Neutrophils Relative %: 64.4 % (ref 43.0–77.0)
Platelets: 283 10*3/uL (ref 150.0–400.0)
RDW: 13.1 % (ref 11.5–14.6)
Total Bilirubin: 0.6 mg/dL (ref 0.3–1.2)
Total CHOL/HDL Ratio: 5
Total Protein, Urine: NEGATIVE mg/dL
Total Protein: 7 g/dL (ref 6.0–8.3)
Triglycerides: 349 mg/dL — ABNORMAL HIGH (ref 0.0–149.0)
Urine Glucose: NEGATIVE mg/dL
VLDL: 69.8 mg/dL — ABNORMAL HIGH (ref 0.0–40.0)

## 2010-02-13 ENCOUNTER — Encounter: Payer: Self-pay | Admitting: Internal Medicine

## 2010-02-13 ENCOUNTER — Ambulatory Visit
Admission: RE | Admit: 2010-02-13 | Discharge: 2010-02-13 | Payer: Self-pay | Source: Home / Self Care | Attending: Internal Medicine | Admitting: Internal Medicine

## 2010-02-13 DIAGNOSIS — B079 Viral wart, unspecified: Secondary | ICD-10-CM | POA: Insufficient documentation

## 2010-03-04 ENCOUNTER — Encounter: Payer: Self-pay | Admitting: Internal Medicine

## 2010-03-07 ENCOUNTER — Telehealth: Payer: Self-pay | Admitting: Gastroenterology

## 2010-03-13 NOTE — Letter (Signed)
Summary: Colonoscopy Letter  [facility]  [address] [place], [place] [address]   Phone: [phone]  Fax: [phone]      November 01, 2009 MRN: [id number]   Debra [name] [address] [place], [place]  [address]   Dear Ms. Debra [name],   According to your medical record, it is time for you to schedule a Colonoscopy. The American Cancer Society recommends this procedure as a method to detect early colon cancer. Patients with a family history of colon cancer, or a personal history of colon polyps or inflammatory bowel disease are at increased risk.  This letter has been generated based on the recommendations made at the time of your procedure. If you feel that in your particular situation this may no longer apply, please contact our office.  Please call our office at [phone] to schedule this appointment or to update your records at your earliest convenience.  Thank you for cooperating with [place] to provide you with the very best care possible.   Sincerely,  Barbette Hair. Arlyce Dice, M.D.  [facility] [phone]

## 2010-03-13 NOTE — Progress Notes (Signed)
----   Converted from flag ---- ---- 09/04/2009 2:41 PM, Verdell Face wrote:   ---- 09/04/2009 12:18 PM, Verdell Face wrote: Debra [name] B (27-Dec-2045) Pt requesting VIT D and TSH as she is very tired. Pt set up for appt 8/5 with Dr Posey Rea and is requesting these labs be ordered, pls advise and let pt know about the labs. She can be reached at [phone] and leave her at message on voice mail.  Elnita Maxwell ------------------------------  Phone Note Call from Patient   Initial call taken by: Verdell Face,  September 05, 2009 10:04 AM    Additional Follow-up for Phone Call Additional follow up Details #2::    ok  Vit D and TSH 244.8 Follow-up by: Tresa Garter MD,  September 04, 2009 10:16 PM  Additional Follow-up for Phone Call Additional follow up Details #3:: Details for Additional Follow-up Action Taken: Orders put in IDX as order states above. Additional Follow-up by: Verdell Face,  September 05, 2009 10:05 AM

## 2010-03-13 NOTE — Progress Notes (Signed)
Summary: LAB ORDER  Phone Note Call from Patient Call back at [phone] ask for Deyana   Caller: Patient Call For: Tresa Garter MD Summary of Call: Pt had labs drawn at [place]/[address] today for tsh.vit d, requesting order for a1c,lipid & liver if time for it? Lab is holding blood to get ok from Dr Posey Rea to do these add'l. Please advise. Call [facility] [address], Mellody Dance is lab tech. Initial call taken by: Verdell Face,  September 11, 2009 10:03 AM  Follow-up for Phone Call        ok Thx Follow-up by: Tresa Garter MD,  September 11, 2009 5:27 PM  Additional Follow-up for Phone Call Additional follow up Details #1::        Order faxed to lab Additional Follow-up by: Lamar Sprinkles, CMA,  September 12, 2009 11:35 AM

## 2010-03-13 NOTE — Progress Notes (Signed)
Summary: VIT D  Phone Note Call from Patient Call back at Home Phone [phone] Call back at [facility] VM ON HM #   Summary of Call: Pt takes a supplement that has 800 IU of Vit D. She also takes Vit D 2000 International Units. Is she taking too much?  Initial call taken by: Lamar Sprinkles, CMA,  February 13, 2009 11:45 AM  Follow-up for Phone Call        I would stay on 2000 international units once daily - we an check level next time( VitD 995.20) Follow-up by: Tresa Garter MD,  February 13, 2009 2:52 PM  Additional Follow-up for Phone Call Additional follow up Details #1::        Left vm for pt Additional Follow-up by: Lamar Sprinkles, CMA,  February 13, 2009 4:06 PM

## 2010-03-13 NOTE — Assessment & Plan Note (Signed)
Summary: over due for 4 mos f/u #/cd   Vital Signs:  Patient profile:   65 year old female Height:      60 inches (152.40 cm) Weight:      156 pounds (70.91 kg) BMI:     30.58 O2 Sat:      95 % on Room air Temp:     98.4 degrees F (36.89 degrees C) oral Pulse rate:   85 / minute BP sitting:   120 / 82  (left arm) Cuff size:   regular  Vitals Entered By: Lucious Groves CMA (September 22, 2009 3:38 PM)  O2 Flow:  Room air CC: Follow-up visit./kb Is Patient Diabetic? Yes Pain Assessment Patient in pain? no        Primary Care Provider:  Georgina Quint Plotnikov MD  CC:  Follow-up visit./kb.  History of Present Illness: The patient presents for a follow up of hypertension, diabetes, hyperlipidemia C/o neck pain x 2-3 months B C/o occasional fatigue   Current Medications (verified): 1)  Prevacid 15 Mg Cpdr (Lansoprazole) .Marland Kitchen.. 1 By Mouth Qd 2)  Aspir-Low 81 Mg Tbec (Aspirin) .Marland Kitchen.. 1 Once Daily Pc 3)  Lipitor 20 Mg Tabs (Atorvastatin Calcium) .... Take 1 Every Other Day 4)  Freestyle Lite Test  Strp (Glucose Blood) .... Once Daily As Needed Dx: 250.00 5)  Krill Oil .Marland Kitchen.. 1 By Mouth Qd 6)  Triamcinolone Acetonide 0.5 % Crea (Triamcinolone Acetonide) .... Apply Bid To Affected Area 7)  Vitamin D 2000 Unit Tabs (Cholecalciferol) .... Take 1 By Mouth Bid 8)  Multivitamins  Tabs (Multiple Vitamin) .... Take 1 By Mouth Qd 9)  Cardizem La 180 Mg Xr24h-Tab (Diltiazem Hcl Coated Beads) .... One By Mouth Daily 10)  Occuvite/lutien 11)  Freestyle Unistick Ii Lancets  Misc (Lancets) .... Test Once Daily As Needed Dx: 250.00 12)  Triamcinolone Acetonide 0.5 % Oint (Triamcinolone Acetonide) .... Use Two Times A Day As Needed On Rash  Allergies (verified): 1)  ! Talwin 2)  ! Biaxin 3)  ! Zocor 4)  ! Niacin 5)  ! Percocet 6)  ! Codeine 7)  ! * Demorol 8)  ! * Crestor 9)  ! * Zetia  Past History:  Past Medical History: Last updated: 11/02/2008 Colonic polyps, hx of Diabetes mellitus,  type II Hyperlipidemia  Macular degeneration Allergic rhinitis  Social History: Last updated: 07/02/2007 Occupation: Adult nurse - cardiology Married Former Smoker Alcohol use-yes Regular exercise-yes  Review of Systems  The patient denies fever, weight loss, and weight gain.    Physical Exam  General:  alert, well-developed, well-nourished, and cooperative to examination.    Eyes:  No corneal or conjunctival inflammation noted. EOMI. Perrla. Nose:  Erythematous throat mucosa and intranasal erythema.  Mouth:  good dentition and pharyngeal erythema.   Neck:  Cervical spine is tender to palpation over paraspinal muscles and with the ROM  Lungs:  normal respiratory effort, no intercostal retractions or use of accessory muscles; normal breath sounds bilaterally - no crackles and no wheezes.    Heart:  normal rate, regular rhythm, no murmur, and no rub. BLE without edema. Abdomen:  soft, non-tender, normal bowel sounds, no distention; no masses and no appreciable hepatomegaly or splenomegaly.   Msk:  no paraspinal spasm of lumbar spine or tenderness to palpation Neurologic:  No cranial nerve deficits noted. Station and gait are normal. Plantar reflexes are down-going bilaterally. DTRs are symmetrical throughout. Sensory, motor and coordinative functions appear intact. Skin:  no rashes, vesicles, ulcers,  or erythema Psych:  Cognition and judgment appear intact. Alert and cooperative with normal attention span and concentration. No apparent delusions, illusions, hallucinations   Impression & Recommendations:  Problem # 1:  NECK PAIN (ICD-723.1) MSK Assessment New See "Patient Instructions".  Pennsaid Her updated medication list for this problem includes:    Aspir-low 81 Mg Tbec (Aspirin) .Marland Kitchen... 1 once daily pc  Problem # 2:  ESSENTIAL HYPERTENSION, BENIGN (ICD-401.1) Assessment: Unchanged  Her updated medication list for this problem includes:    Cardizem La 180 Mg Xr24h-tab  (Diltiazem hcl coated beads) ..... One by mouth daily  Problem # 3:  DIABETES MELLITUS, TYPE II (ICD-250.00) Assessment: Improved  Her updated medication list for this problem includes:    Aspir-low 81 Mg Tbec (Aspirin) .Marland Kitchen... 1 once daily pc  Labs Reviewed: Creat: 0.8 (04/26/2009)    Reviewed HgBA1c results: 5.8 (09/11/2009)  5.6 (04/26/2009)  Problem # 4:  HYPERLIPIDEMIA (ICD-272.4) Assessment: Comment Only  Her updated medication list for this problem includes:    Lipitor 20 Mg Tabs (Atorvastatin calcium) .Marland Kitchen... Take 1 every other day  Problem # 5:  MYALGIA (ICD-729.1) resolved Assessment: Comment Only  Her updated medication list for this problem includes:    Aspir-low 81 Mg Tbec (Aspirin) .Marland Kitchen... 1 once daily pc  Complete Medication List: 1)  Prevacid 15 Mg Cpdr (Lansoprazole) .Marland Kitchen.. 1 by mouth qd 2)  Aspir-low 81 Mg Tbec (Aspirin) .Marland Kitchen.. 1 once daily pc 3)  Lipitor 20 Mg Tabs (Atorvastatin calcium) .... Take 1 every other day 4)  Freestyle Lite Test Strp (Glucose blood) .... Once daily as needed dx: 250.00 5)  Krill Oil  .Marland Kitchen.. 1 by mouth qd 6)  Triamcinolone Acetonide 0.5 % Crea (Triamcinolone acetonide) .... Apply bid to affected area 7)  Vitamin D 2000 Unit Tabs (Cholecalciferol) .... Take 1 by mouth bid 8)  Multivitamins Tabs (Multiple vitamin) .... Take 1 by mouth qd 9)  Cardizem La 180 Mg Xr24h-tab (Diltiazem hcl coated beads) .... One by mouth daily 10)  Occuvite/lutien  11)  Freestyle Unistick Ii Lancets Misc (Lancets) .... Test once daily as needed dx: 250.00 12)  Triamcinolone Acetonide 0.5 % Oint (Triamcinolone acetonide) .... Use two times a day as needed on rash 13)  Pennsaid 1.5 % Soln (Diclofenac sodium) .... 3-5 gtt on skin three times a day for pain  Patient Instructions: 1)  Get a contour pillow 2)  Use stretching and balance exercises that I have provided (15 min. or longer every day)  3)  Please schedule a follow-up appointment in 4 months well w/labs and  A1c 250.00. Prescriptions: PENNSAID 1.5 % SOLN (DICLOFENAC SODIUM) 3-5 gtt on skin three times a day for pain  #1 x 3   Entered and Authorized by:   Tresa Garter MD   Signed by:   Tresa Garter MD on 09/22/2009   Method used:   Print then Give to Patient   RxID:   [id number]

## 2010-03-13 NOTE — Progress Notes (Signed)
Summary: REQ FOR Ointment - Aware MD out of office  Phone Note Call from Patient Call back at Home Phone [phone] Call back at [phone]   Summary of Call: Patient is requesting rx for ointment for when her nose becomes sore. Last rx given was for cream but this does not help. She is requesting rx for triamcinolone 1% ointment.  Initial call taken by: Lamar Sprinkles, CMA,  August 09, 2009 11:50 AM  Follow-up for Phone Call        I am unable to recommend a cream or ointment without having seen the nose/rash being treated.  Recommend OV with AVP or me. Follow-up by: Jacques Navy MD,  August 09, 2009 4:41 PM  Additional Follow-up for Phone Call Additional follow up Details #1::        Spoke w/pt, she wants to wait for Dr Posey Rea to address, aware it will be next week.  Additional Follow-up by: Lamar Sprinkles, CMA,  August 09, 2009 5:48 PM    Additional Follow-up for Phone Call Additional follow up Details #2::    OK Follow-up by: Tresa Garter MD,  August 10, 2009 8:35 AM  Additional Follow-up for Phone Call Additional follow up Details #3:: Details for Additional Follow-up Action Taken: pt informed via personal VM that Rx ointment requested [facility] Additional Follow-up by: Margaret Pyle, CMA,  August 10, 2009 9:11 AM  New/Updated Medications: TRIAMCINOLONE ACETONIDE 0.5 % OINT (TRIAMCINOLONE ACETONIDE) use two times a day as needed on rash Prescriptions: TRIAMCINOLONE ACETONIDE 0.5 % OINT (TRIAMCINOLONE ACETONIDE) use two times a day as needed on rash  #45 g x 3   Entered and Authorized by:   Tresa Garter MD   Signed by:   Margaret Pyle, CMA on 08/10/2009   Method used:   [facility]ronically to        [facility] [phone][facility] (retail)       [address]       [place], [place]  [address]       Ph: [phone] or [phone]       Fax: [phone]   RxID:   [id number]

## 2010-03-15 NOTE — Progress Notes (Signed)
Summary: problems swallowing   Phone Note Call from Patient Call back at [phone]   Caller: Patient Call For: Dr Arlyce Dice Reason for Call: Talk to Nurse Summary of Call: Patient wants to speak to nurse regarding difficulty swallowing, wants to know what meds to take. Patient is scheduled to see Dr Arlyce Dice 2-28 Initial call taken by: Tawni Levy,  March 07, 2010 11:45 AM  Follow-up for Phone Call        Left message to call back Follow-up by: Selinda Michaels RN,  March 07, 2010 2:18 PM  Additional Follow-up for Phone Call Additional follow up Details #1::        Patient states she has not been taking her medications regularly, she takes some otc meds for gerd. Suggested patient take her medications regularly and keep her appointment. Patient verbalized understanding. Additional Follow-up by: Selinda Michaels RN,  March 07, 2010 4:58 PM

## 2010-03-15 NOTE — Assessment & Plan Note (Signed)
Summary: 4 MTH PHYSICAL--STC   Vital Signs:  Patient profile:   65 year old female Height:      60 inches Weight:      158 pounds BMI:     30.97 Temp:     98.5 degrees F oral Pulse rate:   84 / minute Pulse rhythm:   regular Resp:     16 per minute BP sitting:   112 / 70  (left arm) Cuff size:   regular  Vitals Entered By: Lanier Prude, CMA(AAMA) (February 13, 2010 3:21 PM) CC: CPX Is Patient Diabetic? Yes   Primary Care Provider:  Tresa Garter MD  CC:  CPX.  History of Present Illness: The patient presents for a preventive health examination   Current Medications (verified): 1)  Prevacid 15 Mg Cpdr (Lansoprazole) .Marland Kitchen.. 1 By Mouth Qd 2)  Aspir-Low 81 Mg Tbec (Aspirin) .Marland Kitchen.. 1 Once Daily Pc 3)  Lipitor 20 Mg Tabs (Atorvastatin Calcium) .... Take 1 Every Other Day 4)  Freestyle Lite Test  Strp (Glucose Blood) .... Once Daily As Needed Dx: 250.00 5)  Krill Oil .Marland Kitchen.. 1 By Mouth Qd 6)  Triamcinolone Acetonide 0.5 % Crea (Triamcinolone Acetonide) .... Apply Bid To Affected Area 7)  Vitamin D 1000 Unit Tabs (Cholecalciferol) .Marland Kitchen.. 1 By Mouth Two Times A Day 8)  Multivitamins  Tabs (Multiple Vitamin) .... Take 1 By Mouth Qd 9)  Cardizem La 180 Mg Xr24h-Tab (Diltiazem Hcl Coated Beads) .... One By Mouth Daily 10)  Occuvite/lutien 11)  Freestyle Unistick Ii Lancets  Misc (Lancets) .... Test Once Daily As Needed Dx: 250.00 12)  Triamcinolone Acetonide 0.5 % Oint (Triamcinolone Acetonide) .... Use Two Times A Day As Needed On Rash 13)  Pennsaid 1.5 % Soln (Diclofenac Sodium) .... 3-5 Gtt On Skin Three Times A Day For Pain  Allergies (verified): 1)  ! Talwin 2)  ! Biaxin 3)  ! Zocor 4)  ! Niacin 5)  ! Percocet 6)  ! Codeine 7)  ! * Demorol 8)  ! * Crestor 9)  ! 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    Physical Exam  General:  alert, well-developed, well-nourished, and cooperative to examination.    Head:  Normocephalic and atraumatic without obvious abnormalities. No apparent alopecia or balding. Eyes:  No corneal or conjunctival inflammation noted. EOMI. Perrla. Ears:  External ear exam shows no significant lesions or deformities.  Otoscopic examination reveals clear canals, tympanic membranes are intact bilaterally without bulging, retraction, inflammation or discharge. Hearing is grossly normal bilaterally. Nose:  External nasal examination shows no deformity or inflammation. Nasal mucosa are pink and moist without lesions or exudates. Mouth:  good dentition  and pharyngeal erythema.   Neck:  No deformities, masses, or tenderness noted. Lungs:  Normal respiratory effort, chest expands symmetrically. Lungs are clear to auscultation, no crackles or wheezes. Heart:  Normal rate and regular rhythm. S1 and S2 normal without gallop, murmur, click, rub or other extra sounds. Abdomen:  soft, non-tender,  normal bowel sounds, no distention; no masses and no appreciable hepatomegaly or splenomegaly.   Msk:  no paraspinal spasm of lumbar spine or tenderness to palpation Extremities:  no edema, no ulcers  Neurologic:  No cranial nerve deficits noted. Station and gait are normal. Plantar reflexes are down-going bilaterally. DTRs are symmetrical throughout. Sensory, motor and coordinative functions appear intact. Skin:  L post calf 4 mm wart L chest wart Cervical Nodes:  No lymphadenopathy noted Inguinal Nodes:  No significant adenopathy Psych:  Cognition and judgment appear intact. Alert and cooperative with normal attention span and concentration. No apparent delusions, illusions, hallucinations   Impression & Recommendations:  Problem # 1:  ROUTINE GENERAL MEDICAL EXAM@HEALTH  CARE FACL (ICD-V70.0) Assessment New Health and age related issues were discussed. Available screening tests and vaccinations were discussed as well. Healthy life style including good diet and exercise was discussed.  The labs were reviewed with the patient.  Orders: EKG w/ Interpretation (93000)  Problem # 2:  WART, VIRAL (ICD-078.10) x2 Assessment: New Procedure: cryo Indication: wart(s) Risks incl. scar(s), incomplete removal, ect.  and benefits discussed      2 lesion(s) - see PE was/were treated with liqid N2 in usual fasion.  Tolerated well. Compl. none. Wound care instructions given.  Orders: Wart Destruct <14 ([address])  Complete Medication List: 1)  Prevacid 15 Mg Cpdr (Lansoprazole) .Marland Kitchen.. 1 by mouth qd 2)  Aspir-low 81 Mg Tbec (Aspirin) .Marland Kitchen.. 1 once daily pc 3)  Lipitor 20 Mg Tabs (Atorvastatin calcium) .... Take 1 every other day 4)  Freestyle Lite Test Strp (Glucose blood) .... Once daily as needed dx: 250.00 5)  Krill Oil  .Marland Kitchen.. 1 by mouth qd 6)  Triamcinolone Acetonide 0.5 % Crea (Triamcinolone acetonide) .... Apply bid to affected area 7)  Vitamin D 1000 Unit Tabs (Cholecalciferol) .Marland Kitchen.. 1 by mouth two  times a day 8)  Multivitamins Tabs (Multiple vitamin) .... Take 1 by mouth qd 9)  Cardizem La 180 Mg Xr24h-tab (Diltiazem hcl coated beads) .... One by mouth daily 10)  Occuvite/lutien  11)  Freestyle Unistick Ii Lancets Misc (Lancets) .... Test once daily as needed dx: 250.00 12)  Triamcinolone Acetonide 0.5 % Oint (Triamcinolone acetonide) .... Use two times a day as needed on rash 13)  Pennsaid 1.5 % Soln (Diclofenac sodium) .... 3-5 gtt on skin three times a day for pain  Other Orders: Tdap => 29yrs IM ([address]) Admin 1st Vaccine (60454)  Patient Instructions: 1)  Please schedule a follow-up appointment in 4 months. 2)  BMP prior to visit, ICD-9: 3)  HbgA1C prior to visit, ICD-9: 250.00   Orders Added: 1)  EKG w/ Interpretation [93000] 2)  Est. Patient age 62-64 [49] 3)  Wart Destruct <14 [[address]] 4)  Tdap => 33yrs IM [90715] 5)  Admin 1st Vaccine [90471]   Immunizations Administered:  Tetanus Vaccine:    Vaccine Type: Tdap    Site: left deltoid    Mfr: GlaxoSmithKline    Dose: 0.5 ml    Route: IM    Given by: Lanier Prude, CMA(AAMA)    Exp. Date: 12/01/2011    Lot #: [id number]    VIS given: 12/30/07  version given February 13, 2010.   Contraindications/Deferment of Procedures/Staging:    Test/Procedure: Pneumovax vaccine    Reason for deferment: patient declined   Immunizations Administered:  Tetanus Vaccine:    Vaccine Type: Tdap    Site: left deltoid    Mfr: GlaxoSmithKline    Dose: 0.5 ml    Route: IM    Given by: Lanier Prude, CMA(AAMA)    Exp. Date: 12/01/2011    Lot #: [id number]    VIS given: 12/30/07 version given February 13, 2010.

## 2010-04-01 ENCOUNTER — Inpatient Hospital Stay (INDEPENDENT_AMBULATORY_CARE_PROVIDER_SITE_OTHER)
Admission: RE | Admit: 2010-04-01 | Discharge: 2010-04-01 | Disposition: A | Discharge status: Home / Self Care | Payer: Commercial Managed Care - PPO | Source: Ambulatory Visit | Attending: Emergency Medicine | Admitting: Emergency Medicine

## 2010-04-01 DIAGNOSIS — J019 Acute sinusitis, unspecified: Secondary | ICD-10-CM

## 2010-04-10 ENCOUNTER — Ambulatory Visit (INDEPENDENT_AMBULATORY_CARE_PROVIDER_SITE_OTHER): Payer: Commercial Managed Care - PPO | Admitting: Gastroenterology

## 2010-04-10 ENCOUNTER — Encounter: Payer: Self-pay | Admitting: Gastroenterology

## 2010-04-10 DIAGNOSIS — R1310 Dysphagia, unspecified: Secondary | ICD-10-CM | POA: Insufficient documentation

## 2010-04-10 DIAGNOSIS — R110 Nausea: Secondary | ICD-10-CM

## 2010-04-10 NOTE — Procedures (Signed)
Summary: colonoscopy  Dr Kinnie Scales   Colonoscopy  Procedure date:  06/10/2001  Findings:      Results: Hemorrhoids.     Location:  [facility].   Patient Name: Debra [name], Debra [name] MRN: [id number] Procedure Procedures: Colonoscopy CPT: 54098.    with Hot Biopsy(s)CPT: Z451292.    with polypectomy. CPT: A3573898.  Colorectal cancer screening, average risk CPT: G0121.  Personnel: Endoscopist: Griffith Citron, MD, [facility].  Exam Location: Exam performed in Endoscopy Suite. Outpatient  Patient Consent: Procedure, Alternatives, Risks and Benefits discussed, consent obtained, from patient. Consent was obtained by the physician.  Indications  Evaluation of: Positive fecal occult blood test per home screening.  Symptoms: Hematochezia.  Average Risk Screening Routine.  History  Current Medications: H2Blocker: Ranitidine/Zantac Cholesterol Lowering Meds: Lipitor (Atorvastatin)  Medical/ Surgical History: Reflux Disease, Hyperlipidemia,  Allergies: No known allergies. Allergic to Talwin, Biaxin, Zocor.  Patient Habits Patient does not smoke. Drinking Status: non-drinker.  Pre-Exam Physical: Performed Jun 10, 2001. Entire physical exam was normal.  Exam Exam: Extent of exam reached: Cecum, extent intended: Cecum.  The cecum was identified by appendiceal orifice and IC valve. Patient position: on left side. Duration of exam: 25 minutes. Colon retroflexion performed. Images taken. ASA Classification: I. Tolerance: fair.  Monitoring: Pulse and BP monitoring, Oximetry used. Supplemental O2 given.  Colon Prep Used Visicol for colon prep. Dose Used: 28 tablets. Prep results: excellent.  Sedation Meds: Patient assessed and found to be appropriate for moderate (conscious) sedation. Sedation was managed by the Endoscopist. Versed 9 mg. given IV. Fentanyl 100 mcg. given IV.  Comments: Adjustable scope used. Findings POLYP: Ascending Colon, Maximum size: 12 mm. sessile polyp.  Procedure:  snare with cautery, Polyp sent to pathology. ICD9: Colon Polyps: 211.3.  POLYP: Rectum, Maximum size: 6 mm. sessile polyp. Procedure:  hot biopsy, sent to pathology. ICD9: Colon Polyps: 211.3.  HEMORRHOIDS: Internal. Size: Grade III. ICD9: Hemorrhoids, Internal: 455.0.    Comments: Time 2; tech 2; prep 1; Total = 5 Assessment  Diagnoses: 211.3: Colon Polyps.  455.0: Hemorrhoids, Internal. Probable source of rectal bleeding and hemoccult positive stool.  792.1: Hemocult Positive Abnormal Stool.  578.1: Hematochezia, Melena.   Events  Unplanned Interventions: No intervention was required.  Unplanned Events: There were no complications. Plans  Post Exam Instructions: Post sedation instructions given. Resume previous diet: 2 hours. No alcohol: 2 weeks. No aspirin or non-steroidal containing medications: 2 weeks. Restart medications: tonight.  Medication Plan: Anti-constipation: Rowasa Suppository 500 mg HS, starting Jun 10, 2001 for 7 days.   Patient Education: Patient given standard instructions for: Polyps. Hemorrhoids. Yearly hemoccult testing recommended. Repeat colonoscopy in 3 years if polyp(s) is an adenoma, or in 10 years if it is hyperplastic.  Comments: 1)  Please call the office in 1 week to review the polyp pathology report and to discuss recommendations for future colonoscopy. 2)  If the polyp is an adenoma, then all 1st degree relatives (parents, siblings, and children) should be screened for colon cancer beginning at age 58, with colonoscopy. Disposition: After procedure patient sent to recovery. After recovery patient sent home.  This report was created from the original endoscopy report, which was reviewed and signed by the above listed endoscopist.

## 2010-04-10 NOTE — Procedures (Signed)
Summary: colonoscopy   Colonoscopy  Procedure date:  10/23/2004  Findings:      Results: Hemorrhoids.     Location:  [facility].    Comments:      Repeat colonoscopy in 5 years.  Patient Name: Debra [name], Radi MRN: [id number] Procedure Procedures: Colonoscopy CPT: 42595.  Personnel: Endoscopist: Barbette Hair. Arlyce Dice, MD.  Patient Consent: Procedure, Alternatives, Risks and Benefits discussed, consent obtained, from patient.  Indications  Surveillance of: Adenomatous Polyp(s).  History  Current Medications: Patient is not currently taking Coumadin.  Medical/ Surgical History: Reflux Disease, Hyperlipidemia,  Allergies: Allergic to Talwin, Biaxin, Zocor.  Pre-Exam Physical: Performed Oct 23, 2004. Cardio-pulmonary exam, HEENT exam , Abdominal exam, Mental status exam WNL.  Exam Exam: Extent of exam reached: Cecum, extent intended: Cecum.  The cecum was identified by IC valve. Colon retroflexion performed. ASA Classification: II. Tolerance: good.  Monitoring: Pulse and BP monitoring, Oximetry used. Supplemental O2 given. at 2 Liters.  Colon Prep Used Miralax for colon prep. Prep results: good.  Sedation Meds: Patient assessed and found to be appropriate for moderate (conscious) sedation. Sedation was managed by the Endoscopist. Fentanyl 100 mcg. given IV. Versed 12 given IV.  Findings - NORMAL EXAM: Cecum to Sigmoid Colon.  NORMAL EXAM: Cecum.  HEMORRHOIDS: Internal. ICD9: Hemorrhoids, Internal: 455.0.   Assessment Abnormal examination, see findings above.  Diagnoses: 455.0: Hemorrhoids, Internal.   Events  Unplanned Interventions: No intervention was required.  Unplanned Events: There were no complications. Plans  Post Exam Instructions: Post sedation instructions given.  Patient Education: Patient given standard instructions for: Hemorrhoids.  Disposition: After procedure patient sent to recovery. After recovery patient sent home.    Scheduling/Referral: Colonoscopy, to Barbette Hair. Arlyce Dice, MD, around Oct 23, 2009.    This report was created from the original endoscopy report, which was reviewed and signed by the above listed endoscopist.

## 2010-04-19 NOTE — Assessment & Plan Note (Signed)
Summary: difficulty swallowing. sch w pt umr-ins copay and cx fee advi...    History of Present Illness Visit Type: new patient  Primary GI MD: Debra Heaps MD [facility] Primary Provider: Tresa Garter MD Requesting Provider: na Chief Complaint: Pt c/o GERD, bloating, and upper abd pain  History of Present Illness:   [name]  is a pleasant, 65 year old white female referred at the request Dr. Posey [name] for evaluation of dysphagia.   She has developed dysphagia to solids. When this occurs, she may have discomfort in her lower chest. It is sometimes relieved with vomiting. She denies pyrosis. She takes Prilosec daily. She also complains of mild post prandial bloating and heartburn. Weight has been stable.    The patient has a history of type 2 diabetes. Adenomatous polyps were removed in 2003. Colonoscopy in 2006  was negative for polyps.   GI Review of Systems    Reports abdominal pain, acid reflux, bloating, and  heartburn.     Location of  Abdominal pain: upper abdomen.    Denies belching, chest pain, dysphagia with liquids, dysphagia with solids, loss of appetite, nausea, vomiting, vomiting blood, weight loss, and  weight gain.        Denies anal fissure, black tarry stools, change in bowel habit, constipation, diarrhea, diverticulosis, fecal incontinence, heme positive stool, hemorrhoids, irritable bowel syndrome, jaundice, light color stool, liver problems, rectal bleeding, and  rectal pain.    Current Medications (verified): 1)  Prilosec Otc 20 Mg Tbec (Omeprazole Magnesium) .... One Tablet By Mouth Two Times A Day 2)  Lipitor 20 Mg Tabs (Atorvastatin Calcium) .... Take 1 Every Other Day 3)  Freestyle Lite Test  Strp (Glucose Blood) .... Once Daily As Needed Dx: 250.00 4)  Krill Oil .Marland Kitchen.. 1 By Mouth Qd 5)  Triamcinolone Acetonide 0.5 % Crea (Triamcinolone Acetonide) .... Apply Bid To Affected Area 6)  Vitamin D 1000 Unit Tabs (Cholecalciferol) .Marland Kitchen.. 1 By Mouth Two Times A Day 7)   Cardizem La 180 Mg Xr24h-Tab (Diltiazem Hcl Coated Beads) .... One By Mouth Daily 8)  Occuvite/lutien .... Once Daily By Mouth 9)  Freestyle Unistick Ii Lancets  Misc (Lancets) .... Test Once Daily As Needed Dx: 250.00 10)  Pennsaid 1.5 % Soln (Diclofenac Sodium) .... 3-5 Gtt On Skin Three Times A Day For Pain  Allergies (verified): 1)  ! Talwin 2)  ! Biaxin 3)  ! Zocor 4)  ! Niacin 5)  ! Percocet 6)  ! Codeine 7)  ! * Demorol 8)  ! * Crestor 9)  ! * Zetia  Past History:  Past Medical History: Colonic polyps, hx of 2003 Diabetes mellitus, type II Hyperlipidemia Macular degeneration Allergic rhinitis Internal Hemorrhoids GERD Hypertension  Past Surgical History: Hysterectomy complete - 07/2002 Breast reduction, bilateral - 11/2005  Family History: Family History of CAD Female 1st degree relative <60 Family History of Stroke F 1st degree relative <60 No FH of Colon Cancer:  Social History: Occupation: Runner, broadcasting/film/video  Cardiology Married Childern  Regular exercise-yes Patient has never smoked.  Alcohol Use - no Smoking Status:  never  Review of Systems       The patient complains of allergy/sinus.  The patient denies anemia, anxiety-new, arthritis/joint pain, back pain, blood in urine, breast changes/lumps, change in vision, confusion, cough, coughing up blood, depression-new, fainting, fatigue, fever, headaches-new, hearing problems, heart murmur, heart rhythm changes, itching, menstrual pain, muscle pains/cramps, night sweats, nosebleeds, pregnancy symptoms, shortness of breath, skin rash, sleeping problems,  sore throat, swelling of feet/legs, swollen lymph glands, thirst - excessive , urination - excessive , urination changes/pain, urine leakage, vision changes, and voice change.    Vital Signs:  Patient profile:   65 year old female Height:      60 inches Weight:      158 pounds BMI:     30.97 BSA:     1.69 Pulse rate:   76 / minute Pulse rhythm:    regular BP sitting:   124 / 80  (left arm) Cuff size:   regular  Vitals Entered By: Ok Anis CMA (April 10, 2010 3:37 PM)  Physical Exam  Additional Exam:  Physical Exam: General:   WDWN HEENT:   anicteric.  No pharyngeal abnormalities Neck:   No masses, thyroidmegaly Nodes:   No cervical, axillary, inguinal adenopathy Chest:    Clear to auscultation Cardiac:   No murmurs, gallops, rubs Abdomen:   BS active.  No abd masses, tenderness, organomegaly Rectal:   Deferred Extremities:   No cyanosis, clubbing, edema Skeletal:   No deformities Neuro:   Alert, oriented x3.  No focal abnormalities     Impression & Recommendations:  Problem # 1:  DYSPHAGIA UNSPECIFIED (ICD-787.20)   Plan upper endoscopy with dilatation as indicated for a  likely peptic stricture.   Candida esophagitis is less likely.  Orders: EGD SAV (EGD SAV)  Problem # 2:  PERSONAL HISTORY OF COLONIC POLYPS (ICD-V12.72)  Plan followup colonoscopy after resolution of dysphagia.  Patient Instructions: 1)  Copy sent to : Debra Garter MD 2)  Your EGD is scheduled on 04/30/2010 at 2:30pm 3)  Conscious Sedation brochure given.  4)  The medication list was reviewed and reconciled.  All changed / newly prescribed medications were explained.  A complete medication list was provided to the patient / caregiver.

## 2010-04-19 NOTE — Letter (Signed)
Summary: EGD Instructions  [facility]  [address] [place], [place] [address]   Phone: [phone]  Fax: [phone]       Debra [name]    10/03/1945    MRN: [id number]       Procedure Day /Date:MONDAY 04/30/2010     Arrival Time: 1;30pm     Procedure Time:2:30pm     Location of Procedure:                    x  [facility] (4th Floor)    PREPARATION FOR ENDOSCOPY   On3/19/2012  THE DAY OF THE PROCEDURE:  1.   No solid foods, milk or milk products are allowed after midnight the night before your procedure.  2.   Do not drink anything colored red or purple.  Avoid juices with pulp.  No orange juice.  3.  You may drink clear liquids until12:30pm , which is 2 hours before your procedure.                                                                                                CLEAR LIQUIDS INCLUDE: Water Jello Ice Popsicles Tea (sugar ok, no milk/cream) Powdered fruit flavored drinks Coffee (sugar ok, no milk/cream) Gatorade Juice: apple, white grape, white cranberry  Lemonade Clear bullion, consomm, broth Carbonated beverages (any kind) Strained chicken noodle soup Hard Candy   MEDICATION INSTRUCTIONS  Unless otherwise instructed, you should take regular prescription medications with a small sip of water as early as possible the morning of your procedure.          OTHER INSTRUCTIONS  You will need a responsible adult at least 64 years of age to accompany you and drive you home.   This person must remain in the waiting room during your procedure.  Wear loose fitting clothing that is easily removed.  Leave jewelry and other valuables at home.  However, you may wish to bring a book to read or an iPod/MP3 player to listen to music as you wait for your procedure to start.  Remove all body piercing jewelry and leave at home.  Total time from sign-in until discharge is approximately 2-3 hours.  You should go home directly after  your procedure and rest.  You can resume normal activities the day after your procedure.  The day of your procedure you should not:   Drive   Make legal decisions   Operate machinery   Drink alcohol   Return to work  You will receive specific instructions about eating, activities and medications before you leave.    The above instructions have been reviewed and explained to me by   _______________________    I fully understand and can verbalize these instructions _____________________________ Date _________

## 2010-04-30 ENCOUNTER — Other Ambulatory Visit: Payer: Self-pay | Admitting: Gastroenterology

## 2010-04-30 ENCOUNTER — Encounter: Payer: Self-pay | Admitting: Gastroenterology

## 2010-04-30 ENCOUNTER — Encounter (AMBULATORY_SURGERY_CENTER): Payer: 59 | Admitting: Gastroenterology

## 2010-04-30 DIAGNOSIS — R1310 Dysphagia, unspecified: Secondary | ICD-10-CM

## 2010-04-30 DIAGNOSIS — K222 Esophageal obstruction: Secondary | ICD-10-CM

## 2010-04-30 LAB — GLUCOSE, CAPILLARY: Glucose-Capillary: 94 mg/dL (ref 70–99)

## 2010-05-08 ENCOUNTER — Other Ambulatory Visit: Payer: Self-pay | Admitting: *Deleted

## 2010-05-08 MED ORDER — ESOMEPRAZOLE MAGNESIUM 40 MG PO CPDR: 40.0000 mg | DELAYED_RELEASE_CAPSULE | Freq: Every day | ORAL | Status: DC

## 2010-05-08 NOTE — Telephone Encounter (Signed)
FREE FOR PT TO RECEIVE NEXIUM 90 DAY SUPPLY NO CHARGE FOR PT

## 2010-05-09 ENCOUNTER — Telehealth: Payer: Self-pay | Admitting: *Deleted

## 2010-05-09 MED ORDER — GLUCOSE BLOOD VI STRP: 1.0000 | ORAL_STRIP | Freq: Every day | Status: DC

## 2010-05-09 MED ORDER — LANCET DEVICES MISC: 1.0000 | Freq: Every day | Status: DC

## 2010-05-09 NOTE — Telephone Encounter (Signed)
Rxs sent

## 2010-05-10 NOTE — Procedures (Signed)
Summary: Upper Endoscopy w/DIL  Patient: Debra [name] Note: All result statuses are Final unless otherwise noted.  Tests: (1) Upper Endoscopy w/DIL (UED)  UED Upper Endoscopy w/DIL                             DONE     [facility]     [address]     [place], [place]  [address]          ENDOSCOPY PROCEDURE REPORT          PATIENT:  Debra [name], Debra [name]  MR#:  [id number]     BIRTHDATE:  [birth year], 65 yrs. old  GENDER:  female          ENDOSCOPIST:  Barbette Hair. Arlyce Dice, MD     ASSISTANT:     Referred by:  Linda Hedges Plotnikov, M.D.          PROCEDURE DATE:  04/30/2010     PROCEDURE:  EGD, diagnostic, Maloney Dilation of the Esophagus     ASA CLASS:  Class I     INDICATIONS:  1) dysphagia          MEDICATIONS:   Fentanyl 25 mcg IV, Versed 5 mg IV, glycopyrrolate     (Robinal) 0.2 mg IV, 0.6cc simethancone 0.6 cc PO     TOPICAL ANESTHETIC:  Cetacaine Spray          DESCRIPTION OF PROCEDURE:   After the risks benefits and     alternatives of the procedure were thoroughly explained, informed     consent was obtained.  The [id number] [id number] endoscope was     introduced through the mouth and advanced to the third portion of     the duodenum, without limitations.  The instrument was slowly     withdrawn as the mucosa was carefully examined.     <<PROCEDUREIMAGES>>          A stricture was found at the gastroesophageal junction (see     image1). Early stricture  The examination was otherwise normal     (see image2, image3, image4, image5, image6, and image8).     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     RECOMMENDATIONS:     1) dilatations PRN          REPEAT EXAM:   No          ______________________________     Barbette Hair. Arlyce Dice, MD          CC:          n.     eSIGNED:   Barbette Hair. Ranisha Allaire at 04/30/2010 02:59 PM          Elenor Legato, [id number]  Note: An exclamation mark (!) indicates a result that was not dispersed into the flowsheet. Document Creation Date: 04/30/2010 2:59 PM _______________________________________________________________________  (1) Order result status: Final Collection or observation date-time: 04/30/2010 14:51 Requested date-time:  Receipt date-time:  Reported date-time:  Referring Physician:   Ordering Physician: Melvia Heaps [id number]) Specimen Source:  Source: Launa Grill Order Number: [id number] Lab site:

## 2010-06-08 ENCOUNTER — Other Ambulatory Visit: Payer: Self-pay

## 2010-06-15 ENCOUNTER — Ambulatory Visit: Payer: Self-pay | Admitting: Internal Medicine

## 2010-06-18 ENCOUNTER — Ambulatory Visit: Payer: Self-pay | Admitting: Internal Medicine

## 2010-06-26 NOTE — Assessment & Plan Note (Signed)
[facility]                            CARDIOLOGY OFFICE NOTE   NAME:Debra [name], Debra [name]                         MRN:          [id number]  DATE:01/22/2007                            DOB:          06-09-1945    PRIMARY CARE PHYSICIAN:  Dr. Posey Rea.   REASON FOR PRESENTATION:  Evaluate patient with chest discomfort.   HISTORY OF PRESENT ILLNESS:  The patient is a pleasant 65 year old white  female with a past history of chest discomfort.  She had a stress test  in 2005 that suggested some inferior ischemia on perfusion imaging.  However, cardiac catheterization demonstrated an ejection fraction of  65%, and no evidence of coronary disease other than a 20% proximal left  anterior descending lesion.   Since that time she has done well and has had no further cardiovascular  testing or complaints.  She is not particularly active, though she does  her routine activities of daily living without bringing on symptoms.  She denies any symptoms with this.   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  She developed chest discomfort.  This was  substernal, is somewhat tight.  She had pain between her shoulder blades  as well.  She pain in the left posterior side of her neck.  At its peak  it was 8 out of 10 in intensity.  She left the front desk and came back  here.  She was hypertensive with a blood pressure of 166/110.  Heart  rate was 78.  EKG demonstrated no acute changes.  She says the chest  pain has improved and is almost gone, though she is still having some  back discomfort.  She is certainly much more comfortable than she was.  She did not have any diaphoresis, nausea, vomiting.  She is not having  any palpitations, presyncope, or syncope.   PAST MEDICAL HISTORY:  1. Nonobstructive coronary disease.  2. Hyperlipidemia.   PAST SURGICAL HISTORY:  1. Hysterectomy.  2. Tubal pregnancy.  3. Strep  throat.   ALLERGIES:  CODEINE.   MEDICATIONS:  1. Lipitor 20 mg daily.  2. Aspirin 81 mg daily.  3. Multivitamin.   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  She  is married and has 2 children.  She quit smoking 30 years ago.  She  smoked for only less than 1 year.  She rarely drinks alcohol.   FAMILY HISTORY:  Noncontributory for early coronary artery disease.  There is a long history of hypertension.   REVIEW OF SYSTEMS:  As stated in the HPI and positive for reflux, leg  cramps.  Negative for other systems.   PHYSICAL EXAMINATION:  The patient is in no distress.  Blood pressure 166/110, heart rate 78 and regular (repeat blood pressure  156/95).  HEENT:  Eyelids unremarkable, pupils are equal, round, and reactive to  light, fundi not visualized.  Oral mucosa unremarkable.  NECK:  No jugular venous distension, wave form within normal limits,  carotid upstroke  brisk and symmetrical.  No bruit.  No thyromegaly.  LYMPHATICS:  No cervical, axillary, inguinal adenopathy.  LUNGS:  Clear to auscultation bilaterally.  BACK:  No costovertebral angle tenderness.  CHEST:  Unremarkable.  HEART:  PMI not displaced or sustained.  S1 and S2 are within normal  limits.  No S3, no S4.  No clicks, no rubs, no murmurs.  ABDOMEN:  Flat, positive bowel sounds, normal in frequency and pitch.  No bruits, no rebound, no guarding.  No midline pulsatile mass.  No  hepatomegaly, no splenomegaly.  SKIN:  No rashes, no nodules.  EXTREMITIES:  2+ pulses throughout, no edema.  No cyanosis, no clubbing.  NEURO:  Oriented to person, place, and time.  Cranial nerves II-XII  grossly intact, motor grossly intact.   EKG sinus rhythm, rate 78, axis within normal limits, intervals within  normal limits.  Poor anterior R-wave progression, no acute ST-T wave  changes.   ASSESSMENT AND PLAN:  1. Chest and back discomfort:  I think this is atypical.  She had a      workup 3 years ago  with nonobstructive coronary disease, and      actually only a minimal 20% left anterior descending plaque.  There      is no objective evidence of ischemia.  She was under emotional      stress.  I think this is most likely musculoskeletal, and will      suggest that she goes home today and relaxes.  She will keep her      blood pressure check at home, and will let us know if she has any      continued symptoms.  If she has recurrent chest pain in similar      situations or unprovoked, I would pursue another stress perfusion      study.  2. Hypertension:  Her blood pressure is elevated, though it is coming      down.  I have asked her to keep her blood pressure checked at home,      and to get it checked periodically here.  I would have a low      threshold for antihypertensive therapy, and will review these      results.  3. Risk reduction:  She remains on Lipitor per Dr. Posey Rea.  She      remains on aspirin.  4. Followup will be based on symptoms.     Rollene Rotunda, MD, [facility]  Electronically Signed    JH/MedQ  DD: 01/22/2007  DT: 01/22/2007  Job #: [id number]   cc:   Georgina Quint. Plotnikov, MD

## 2010-06-26 NOTE — Op Note (Signed)
[name], [name]                  ACCOUNT NO.:  [id number]   MEDICAL RECORD NO.:  [id number]          PATIENT TYPE:  AMB   LOCATION:  DSC                          FACILITY:  [facility]   PHYSICIAN:  Cindee Salt, M.D.       DATE OF BIRTH:  1945/08/29   DATE OF PROCEDURE:  01/14/2008  DATE OF DISCHARGE:                               OPERATIVE REPORT   PREOPERATIVE DIAGNOSIS:  Foreign body, right index finger.   POSTOPERATIVE DIAGNOSIS:  Foreign body, right index finger.   OPERATION:  Excision of foreign body with granuloma, right index finger.   SURGEON:  Cindee Salt, MD.   ANESTHESIA:  Forearm based IV regional.   ANESTHESIOLOGIST:  W. Autumn Patty, MD.   HISTORY:  The patient is a 65 year old female who suffered an injury to  her right index finger, puncture wound with a piece of glass.  She  feels, however, there are still portions left, she desires having this  removed.  Ultrasound reveals that there is an area present, which did  not show upon x-ray.  The patient is aware of risks and complications,  that there is a chance that this cannot be found; possibility of  recurrent infection; injury to arteries, nerves, and tendons; and  dystrophy.  In the preoperative area, the patient was seen.  The  extremity was marked by both the patient and surgeon.  Antibiotic was  given.   PROCEDURE:  The patient was brought to the operating room.  Forearm-  based IV regional anesthetic was carried out without difficulty.  She  was prepped using DuraPrep, supine position, right arm free.  A time-out  was taken.  After adequate anesthesia was afforded the patient, a  longitudinal incision was made directly over the puncture wound, carried  down through the subcutaneous tissue, a small piece of shard of glass  was removed, subcutaneous tissue.  A foreign body granuloma was then  encountered.  This was removed and sent to Pathology.  No further  lesions were identified.  After all inspection  was performed, no further  lesions and no further foreign material was noted.  This was copiously  irrigated with saline and closed with interrupted 5-0 Vicryl Rapide  sutures.  A sterile compressive dressing was applied.  The patient  tolerated the procedure well.  She was taken to the recovery room for  observation in satisfactory condition.  She will be discharged to home  to return to the [facility] [place] in 1 week on Darvocet-N 100.           ______________________________  Cindee Salt, M.D.     GK/MEDQ  D:  01/14/2008  T:  01/15/2008  Job:  [id number]

## 2010-06-29 NOTE — Procedures (Signed)
[facility]                            ABDOMINAL ULTRASOUND REPORT   NAME:[name], Debra [name]                         MRN:          [id number]  DATE:10/25/2005                            DOB:          02/05/1946    ACCESSION # [id number]   RESULTS:  Abdominal aorta is normal, maximal diameter 1.7 cm. The IVC is  patent.   Pancreas normal.   Gallbladder generally appears normal without thickened walls or evidence of  gallstones. There are several small 5-7 mm non shadowing stable polypoid  lesions noted. Common duct normal at 3.8 mm.   Liver normal.   Kidneys normal, right 9.5, left 10.7 cm. Spleen normal at 10.7 cm.   ASSESSMENT:  This is a normal upper abdominal ultrasound exam except for the  small gallbladder polyps. There is no evidence of cholelithiasis or biliary  ductal dilatation. The pancreas, kidneys and liver appear normal without  abnormalities.                                   Vania Rea. Jarold Motto, MD, Clementeen Graham, [place]   DRP/MedQ  DD:  10/25/2005  DT:  10/25/2005  Job #:  [id number]

## 2010-06-29 NOTE — Discharge Summary (Signed)
   NAME:  Debra [name], Debra [name]                            ACCOUNT NO.:  [id number]   MEDICAL RECORD NO.:  [id number]                   PATIENT TYPE:  INP   LOCATION:  9304                                 FACILITY:  [facility]   PHYSICIAN:  Juluis Mire, M.D.                DATE OF BIRTH:  1945-04-20   DATE OF ADMISSION:  07/20/2002  DATE OF DISCHARGE:  07/23/2002                                 DISCHARGE SUMMARY   ADMITTING DIAGNOSES:  Uterine fibroids.   DISCHARGE DIAGNOSES:  Uterine fibroids.   OPERATIVE PROCEDURE:  Total abdominal hysterectomy with bilateral salpingo-  oophorectomy.   HISTORY AND PHYSICAL:  For complete history and physical see dictated note.   HOSPITAL COURSE:  The patient underwent the above noted surgery.  Pathology  was indeed benign.  Postoperatively did have an ileus.  She was discharged  home on her third postoperative day.  At that time she was afebrile with  stable vital signs.  Abdomen was soft and nontender.  Bowel sounds were  active.  She was passing flatus.  She was tolerating her diet.  Low  transverse incision was intact.  She had no active bleeding.  Voiding  without difficulty.  She was also ambulating without difficulty.  Postoperative hemoglobin was 12.8.   COMPLICATIONS:  None encountered during stay in hospital.  The patient  discharged home in stable condition.   DISPOSITION:  Routine postoperative instructions were given.  She is to  avoid heavy lifting, vaginal entrance, or driving a car.  Discharged home on  Darvocet as needed for pain.  She will call with fever, heavy bleeding,  increasing abdominal pain, nausea or vomiting and reevaluate in the office  in one week.                                               Juluis Mire, M.D.    JSM/MEDQ  D:  07/23/2002  T:  07/23/2002  Job:  [id number]

## 2010-06-29 NOTE — Op Note (Signed)
NAME:  Debra [name], KEISLER                            ACCOUNT NO.:  [id number]   MEDICAL RECORD NO.:  [id number]                   PATIENT TYPE:  INP   LOCATION:  9399                                 FACILITY:  [facility]   PHYSICIAN:  Juluis Mire, M.D.                DATE OF BIRTH:  11/17/1945   DATE OF PROCEDURE:  07/20/2002  DATE OF DISCHARGE:                                 OPERATIVE REPORT   PREOPERATIVE DIAGNOSIS:  Uterine fibroids.   POSTOPERATIVE DIAGNOSIS:  Uterine fibroids.   PROCEDURE:  Total abdominal hysterectomy with bilateral salpingo-  oophorectomy.   SURGEON:  Juluis Mire, M.D.   ASSISTANT:  Stann Mainland. Vincente Poli, M.D.   ANESTHESIA:  General endotracheal anesthesia.   ESTIMATED BLOOD LOSS:  300 mL.   PACKS AND DRAINS:  None.   FLUIDS REPLACED:  There was no intraoperative blood placed.   COMPLICATIONS:  None.   INDICATIONS FOR PROCEDURE:  This was dictated in the history and physical.   DESCRIPTION OF PROCEDURE:  The patient was taken to the OR and placed in the  supine position.  After satisfactory level of general endotracheal  anesthesia was obtained, the patient was placed in the frog legged position.  The abdomen, perineum and vagina were prepped out with Betadine.  A Foley  catheter was placed to straight drain. The patient was then placed in the  supine position, draped as a sterile field.  A low transverse skin incision  was made with the knife and carried through subcutaneous tissue.  The  anterior rectus fascia was entered sharply and the incision in the fascia  extended laterally. The fascia taken off the muscle superiorly and  inferiorly.  Rectus muscles were separated in the midline. The perineum was  entered.  Incision in the perineum extended both superiorly and inferiorly.  Uterus was then delivered through the incision.  There was a large fundal  fibroid. Tubes and ovaries were otherwise unremarkable.  The right round  ligament was clamped, cut,  and suture ligated with 0 Vicryl.  The right  utero-ovarian pedicle was isolated, clamped, cut and secured with a free tie  of 0 Vicryl.  The bladder flap was then developed.  The right uterine  vessels were skeletonized, clamped, cut, and suture ligated with 0 Vicryl.  Next, the left utero-ovarian pedicle and round ligament were clamped, cut,  and ligated with a free tie of 0 Vicryl. We then developed the bladder flap  on the left side.  The left uterine vessels were clamped, cut, and suture  ligated with 0 Vicryl.  We then excised the uterine fundus from the lower  uterine segment, passed off the operative field. The uterine and cervical  stump were secured with a single-tooth tenaculum.  The bladder was further  dissected over the lower uterine segment using the clamp, cut, and tie  technique, the perimetrium serially separated  from the sides of the  paracervical stump.  Vaginal angles were clamped and cut and the vaginal  mucosa excised and the cervical stump was passed off the operative field.  The held pedicles were suture ligated with 0 Vicryl. The intervening vaginal  mucosa was closed with interlocking suture of 0 Vicryl.  We had good  hemostasis and we therefore went back to the ovaries.  The right ovary was  elevated.  The right ureter was identified.  The ovarian vasculature was  isolated above the ureter, clamped and cut and the ovary was passed off the  operative field. The held pedicle was secured with two ties, first a free  tie of 0 Vicryl and then a suture ligature of 0 Vicryl.  We had good  hemostasis.  We then went to the left ovary.  It was elevated with the  Babcock.  Ureter was identified.  The left ovarian vasculature was clamped  and cut and the ovary was passed off the operative field.  The held pedicle  was secured with first a free tie of 0 Vicryl, then a suture ligature of 0  Vicryl.  We had prior to that clamped the round ligament, cut it, and  secured it with  a suture ligature of 0 Vicryl as it had been previously  incorporated in the ovary.  We had good hemostasis.  The pelvic cavity was  thoroughly irrigated.  The appendix was visualized and noted to be normal.  All packs and self-retaining retractors were removed.  At this point in  time, the peritoneum and muscle were closed with running suture of 0 Vicryl.  Fascia was closed with running suture of 0 PDS.  The skin was closed with  staples and Steri-Strips.  Sponge, needle and instrument counts were  reported as correct by the circulating nurse x2.  Foley catheter remained  clear at the time of closure.  The patient tolerated the procedure well and  was returned to the recovery room in good condition.                                               Juluis Mire, M.D.    JSM/MEDQ  D:  07/20/2002  T:  07/20/2002  Job:  [id number]

## 2010-06-29 NOTE — Op Note (Signed)
NAMEDELMAR, DONDERO                  ACCOUNT NO.:  [id number]   MEDICAL RECORD NO.:  [id number]          PATIENT TYPE:  AMB   LOCATION:  DSC                          FACILITY:  [facility]   PHYSICIAN:  Consuello Bossier., M.D.DATE OF BIRTH:  October 20, 1945   DATE OF PROCEDURE:  12/10/2005  DATE OF DISCHARGE:                                 OPERATIVE REPORT   PREOPERATIVE DIAGNOSIS:  Symptomatic bilateral mammary hypertrophy.   POSTOPERATIVE DIAGNOSIS:  Symptomatic bilateral mammary hypertrophy.   OPERATION:  Bilateral reduction mammoplasty.   SURGEON:  Pleas Patricia, MD.   ASSISTANTEnedina Finner, RNFA.   ANESTHESIA:  General endotracheal.   FINDINGS:  The patient had symptomatic bilateral mammary hypertrophy with  discomfort in her chest, upper shoulder and back area.  The above surgical  procedure was carried out.   PROCEDURE:  The patient was brought to the operating room, given a general  endotracheal anesthesia, prepped with Betadine and draped about both breasts  in sterile fashion.  She had previously been marked in the upright position  for the planned surgical procedure to take the nipple-areolar complex up to  24 cm from the sternal notch.  She had a very broad-based breast.  Initially, the keyhole area, as well as the inferior pedicle were  deepithelialized.  An inframammary incision was made medially and continued  down to pectoralis major fascia, and then continued upward to the new nipple  position.  A similar procedure was carried out laterally in a vertical  bipedicle nipple-areolar graft.  An incision was made at the nipple of the  central pedicle, and then upward, leaving a 1 cm in depth superior pedicle.  A large triangular segment of medial full-thickness breast tissue was  removed in continuity with his central segment, as well as an even larger  lateral segment.  Bleeding was controlled with the electrocautery unit to  get good hemostasis.  And 412 g was  removed from the left breast, 401 g from  the right breast.  At this point, there was noted to be hemostasis, and the  wounds were closed first by approximating the medial and lateral flaps to a  predetermined position along the inframammary line with an interrupted #2  Vicryl.  The nipple was inset with interrupted #3 Monocryl in the vertical  incision, circumareolar and transverse incisions also closed with  interrupted #3 Monocryl.  At this point, the circumareolar, vertical and  inframammary incisions were further closed with a continuous 4-0 Monocryl.  Nipple color was excellent and the breast appeared to be symmetrical.  Steri-  Strips,  Xeroform, fluffs, ABDs and a circumthoracic Ace bandage were applied.  The  patient tolerated the procedure well and was able to be discharged from the  operating room, subsequently to the recovery room, and to be admitted to the  [facility] for overnight observation.      Consuello Bossier., M.D.  Electronically Signed     HH/MEDQ  D:  12/10/2005  T:  12/10/2005  Job:  [id number]

## 2010-06-29 NOTE — H&P (Signed)
NAME:  Debra [name], LOTTER NO.:  [id number]   MEDICAL RECORD NO.:  [id number]                   PATIENT TYPE:   LOCATION:                                       FACILITY:   PHYSICIAN:  Juluis Mire, M.D.                DATE OF BIRTH:   DATE OF ADMISSION:  07/20/2002  DATE OF DISCHARGE:                                HISTORY & PHYSICAL   The patient is a 65 year old gravida 4, para 2, abortus 2 American white  female who presents for total abdominal  hysterectomy and bilateral salpingo-  oophorectomy for management of uterine enlargement given fibroids.   In relation to the present admission, the patient is followed for uterine  fibroids for some time.  She is menopausal at the present time.  She has  been experiencing continued abnormal uterine bleeding in the form of  continuous bleeding off an on.  She underwent a previous saline infusion  ultrasound in 2003 and subsequently underwent hysteroscopy.  At that time,  she was noted to have multiple fibroids.  There was some intrauterine  distortion.  She had multiple biopsies; all of which came back benign.  She  only had some very focal simple hyperplasia without atypia.  Other biopsies  were consistent with known uterine fibroids.  She has continued to have  bleeding since our hysteroscopic evaluation.  An ultrasound evaluation was  repeated and it appears that the largest fibroid has enlarged measuring 11.9  cm.  It is abutting on the endometrium.  Both ovarian volumes are normal.  In view of her menopausal status and large intrauterine fibroids with  continued bleeding, she now presents for total abdominal hysterectomy with  bilateral salpingo-oophorectomy for management.   In terms of allergies, she is allergic to [place].   MEDICATIONS:  1. Lipitor.  2. Xenical.  3. Calcium.  4. Aspirin weekly.   PAST MEDICAL HISTORY:  She does have a history of hypercholesterolemia.  She  is on  medications as noted and followed by Dr. Posey Rea for this.  Otherwise, usual childhood diseases without any significant sequela.   PREVIOUS SURGICAL HISTORY:  She has had a previous removal of a right  ectopic pregnancy in 1997.   OBSTETRICAL HISTORY:  She had spontaneous vaginal delivery in 1969 and 1978.  She had one spontaneous abortion in 1997.   FAMILY HISTORY:  Father is diabetic. Both mother and father have high blood  pressure.   SOCIAL HISTORY:  No tobacco or alcohol use.   REVIEW OF SYSTEMS:  Noncontributory.   PHYSICAL EXAMINATION:  VITAL SIGNS:  The patient is afebrile with stable  vital signs.  HEENT:  The patient normocephalic.  Pupils equal, round and reactive to  light and accommodation.  Extraocular movements are intact.  Sclerae and  conjunctivae clear.  Oropharynx clear.  NECK:  Without thyromegaly.  BREASTS:  No discrete masses.  LUNGS:  Clear.  CARDIOVASCULAR:  Regular  rate and rhythm without murmurs or gallops.  There  is no carotid bruits.  ABDOMEN:  Benign.  No masses, organomegaly or tenderness.  PELVIC:  Normal external genitalia.  Vaginal mucosa clear.  Cervix  unremarkable.  Uterus enlarged with multiple fibroids. We feel at the  present time it is measuring approximately 12-14 weeks in size.  Adnexa  unremarkable.  RECTAL/VAGINAL:  Clear.  EXTREMITIES:  Trace edema.  NEUROLOGIC:  Grossly within normal limits.   IMPRESSION:  1. Enlarging uterine fibroids with associated abnormal bleeding in a     postmenopausal female.  2. Hypercholesterolemia.   PLAN:  The patient to undergo total abdominal hysterectomy with bilateral  salpingo-oophorectomy for management of enlarging uterine fibroids.  Obviously, in somebody that is menopausal fibroids should decrease in size  therefore raising the concern leading to total abdominal hysterectomy.  Ovaries will be removed in view of her menopausal status.  The risks of  surgery have been discussed including  the risk of infection.  The risk of  hemorrhage could necessitate transfusion with the risks of AIDS of  hepatitis.  Risk of injury to adjacent organs including bladder, bowel or  ureters that could require further exploratory surgery.  The risk of deep  venous thrombosis and pulmonary embolus.  The patient expressed and  understanding of indications and risks.                                                 Juluis Mire, M.D.    JSM/MEDQ  D:  07/20/2002  T:  07/20/2002  Job:  [id number]

## 2010-06-29 NOTE — Cardiovascular Report (Signed)
NAME:  Debra [name], Debra [name]                            ACCOUNT NO.:  [id number]   MEDICAL RECORD NO.:  [id number]                   PATIENT TYPE:  OIB   LOCATION:  6501                                 FACILITY:  [facility]   PHYSICIAN:  Learta Codding, M.D. [facility]             DATE OF BIRTH:  07-22-1945   DATE OF PROCEDURE:  10/03/2003  DATE OF DISCHARGE:                              CARDIAC CATHETERIZATION   REFERRING PHYSICIAN:  Dr. Posey Rea   PROCEDURES PERFORMED:  1. Left heart catheterization with selective coronary angiography.  2. Ventriculography.   INDICATIONS:  [name] is a 65 year old female patient of Dr. Myrtis Ser who has  been evaluated for atypical chest pain.  The patient had an abnormal  Cardiolite stress study and has been referred for diagnostic catheterization  to assess her coronary anatomy.   DESCRIPTION OF PROCEDURE:  After informed consent was obtained the patient  was brought to the catheterization laboratory.  Her right groin was  sterilely prepped and draped.  4-French arterial sheath was placed using the  modified Seldinger technique.  Subsequently, JL4 and JR4 catheters were used  for selective coronary angiography using manual injection of contrast.  Ventriculography was performed in single plane RAO projection with a 4-  French pigtail catheter and power injection.  At termination of the  procedure all catheters and sheaths were removed and no complications were  encountered.   FINDINGS:  1. Hemodynamics:  Left ventricular pressure 112/12 mmHg.  Aortic pressure     120/65 mmHg.  There was no gradient across the aortic valve.  2. Ventriculography ejection fraction 70%.  No segmental wall motion     abnormalities.  No significant mitral regurgitation.  3. Selective coronary angiography.     a. Left main coronary artery is a large caliber vessel with no evidence        of flow limiting disease.     b. Left anterior descending artery was a large caliber vessel  wrapping        around the apex.  There was no significant flow limiting disease,        although there was approximately 20% smooth plaque in the proximal        LAD.  The diagonal branches were free of flow limiting disease.     c. Circumflex coronary artery:  Circumflex proper was very small.  There        was a very large first obtuse marginal branch which extended all the        way to the apex.  This was a large caliber vessel with no evidence of        flow limiting lesions.     d. The right coronary artery was a large caliber vessel terminating in a        posterior descending artery and two smaller posterolateral branches.        There  were no flow limiting lesions seen in this vessel.   RECOMMENDATIONS:  Angiographic images were reviewed and the results  communicated to the patient.  Dr. Myrtis Ser will be notified of the findings.  The plan is to have this patient follow up in the office.  It does appear  that the chest pain is atypical and unlikely related to flow limiting  coronary artery disease.                                               Learta Codding, M.D. [facility]    GED/MEDQ  D:  10/03/2003  T:  10/03/2003  Job:  [id number]   cc:   Georgina Quint. Plotnikov, M.D. [facility]   Willa Rough, M.D.

## 2010-06-29 NOTE — Op Note (Signed)
[facility]  Patient:    Debra [name], Debra [name] Visit Number: [id number] MRN: [id number]          Service Type: DSU Location: [facility] Attending Physician:  Frederich Balding Dictated by:   Juluis Mire, M.D. Proc. Date: 04/23/01 Admit Date:  04/23/2001                             Operative Report  PREOPERATIVE DIAGNOSIS:       Postmenopausal bleeding, rule out endometrial                               pathology.  POSTOPERATIVE DIAGNOSES:      1. Postmenopausal bleeding, rule out endometrial                                  pathology.                               2. Uterine fibroids.  OPERATION:                    Hysteroscopy with multiple endometrial biopsies.  SURGEON:                      Juluis Mire, M.D.  ANESTHESIA:                   General.  ESTIMATED BLOOD LOSS:         Minimal.  PACKS AND DRAINS:             None.  INTRAOPERATIVE BLOOD REPLACEMENT:                  None.  COMPLICATIONS:                None.  INDICATIONS:                  Are notated in the History and Physical.  DESCRIPTION OF PROCEDURE:     The patient was taken to the OR and placed in the supine position.  After a satisfactory level of general anesthesia was obtained, the patient was placed in the dorsolithotomy position with the Allen stirrups.  The perineum and vagina were prepped out with Betadine and draped in a sterile field.  Examination under anesthesia revealed multiple uterine fibroids above the symphysis, approximately 12-13 weeks in size.  Adnexa was difficult to assess.  A speculum was placed in the vaginal vault.  The cervix was grasped with a single tooth tenaculum.  The uterus sounded to approximately 10-12 cm.  The cervix was serially dilated to a size 35 Pratt dilator.  The hysteroscope was then introduced.  The intrauterine cavity was enlarged.  There was a large anterior fibroid that appeared to be mostly intramural, and therefore, was not  attempted to be resected.  The endometrium was otherwise smooth and atrophic.  We did multiple biopsies posterolaterally and into the fibroid, and these were sent for pathologic review.  We had no active bleeding.  Endometrial curettings were then obtained.  At this point in time, hysteroscopically, everything was intact.  There was no perforations or active bleeding.  The hysteroscope was removed along with the single tooth tenaculum and speculum.  The bladder was emptied by catheterization.  The patient was taken out of the dorsolithotomy position once alert and extubated, and transferred to the recovery room in good condition.  Sponge, instrument, and needle count was reported as correct by the circulating nurse. Dictated by:   Juluis Mire, M.D. Attending Physician:  Frederich Balding DD:  04/23/01 TD:  04/24/01 Job: [id number]

## 2010-06-29 NOTE — H&P (Signed)
[facility], [place] [facility]  Patient:    Debra [name], Debra [name] Visit Number: [id number] MRN: [id number]          Service Type: Attending:  Juluis Mire, M.D. Dictated by:   Juluis Mire, M.D. Adm. Date:  04/23/01                           History and Physical  REASON FOR ADMISSION:         The patient is a 65 year old gravida 3 para 2 abortus 1 married female who presents for a hysteroscopy D&C for evaluation of abnormal uterine bleeding.  HISTORY OF PRESENT ILLNESS:   The patient was seen in our office on November 04, 2001 for a routine physical.  Had been followed with fibroids that had remained relatively asymptomatic and stable in size.  We had performed an ultrasound and endometrial sampling on Jun 24, 2000 for evaluation of abnormal bleeding.  She had a proliferative type endometrium with no evidence of hyperplasia.  Subsequently, in September during her annual exam, an [facility] was drawn which was markedly elevated consistent with menopausal status.  She remained relatively amenorrheic with no periods in November or December. Subsequently had increasing bleeding in January.  Ultrasound at that time revealed a markedly thickened endometrium measuring 21.3 mm.  She had multiple fibroids that were basically unchanged in size.  Because of the thickened endometrium, the patient now presents for hysteroscopic evaluation to rule out polyps or other intrauterine process such as hyperplasia.  ALLERGIES:                    TALWIN.  MEDICATIONS:                  Biaxin and Lipitor.  PAST MEDICAL HISTORY:         History of hypercholesterolemia for which she is on the above medications and she is followed by Dr. Posey Rea.  Otherwise, usual childhood diseases without any significant sequelae.  PREVIOUS SURGICAL HISTORY:    She has had the previous removal of a right ectopic pregnancy in 1997.  OBSTETRICAL HISTORY:          She had a spontaneous vaginal delivery in  1969. In 1997, as noted, she had the removal of a right ectopic pregnancy.  In 1997, spontaneous abortion.  In 1978, a spontaneous vaginal delivery.  FAMILY HISTORY:               Father is a diabetic.  Both mom and father have high blood pressure.  SOCIAL HISTORY:               No tobacco or alcohol use.  REVIEW OF SYSTEMS:            Noncontributory.  PHYSICAL EXAMINATION:  VITAL SIGNS:                  Patient is afebrile with stable vital signs.  HEENT:                        Patient normocephalic.  Pupils equal, round, and reactive to light and accommodation.  Extraocular movements were intact. Sclerae and conjunctivae were clear.  Oropharynx clear.  NECK:                         Without thyromegaly.  BREASTS:  No discrete masses.  LUNGS:                        Clear.  CARDIOVASCULAR:               Regular rhythm and rate, no murmurs or gallops.  ABDOMEN:                      Benign.  No masses, organomegaly, or tenderness.  PELVIC:                       Normal external genitalia, vaginal mucosa is clear.  Cervix unremarkable.  Uterus enlarged with multiple fibroids, approximately 10 to 12 weeks in size.  Adnexa unremarkable.  Rectovaginal exam is clear.  EXTREMITIES:                  Trace edema.  NEUROLOGIC:                   Grossly within normal limits.  IMPRESSION:                   1. Abnormal bleeding, rule out endometrial                                  pathology.                               2. Uterine fibroids.  PLAN:                         The patient to undergo hysteroscopy with D&C. The risks have been discussed, including the risk of anesthesia; the risk of infection; the risk of vascular injury that could lead to hemorrhage requiring hysterectomy or possible transfusion; transfusion carries the risk of AIDS or hepatitis; the risk of injury to adjacent organs through uterine perforation that could require laparoscopy with  possible exploratory surgery; the risk of fluid overload that could lead to pulmonary edema or hyponatremia; the risk of deep venous thrombosis and pulmonary embolus.  The patient professed an understanding of the indications and risks. Dictated by:   Juluis Mire, M.D. Attending:  Juluis Mire, M.D. DD:  04/23/01 TD:  04/23/01 Job: [id number]

## 2010-07-04 ENCOUNTER — Other Ambulatory Visit (INDEPENDENT_AMBULATORY_CARE_PROVIDER_SITE_OTHER): Payer: 59 | Admitting: *Deleted

## 2010-07-04 DIAGNOSIS — E119 Type 2 diabetes mellitus without complications: Secondary | ICD-10-CM

## 2010-07-04 LAB — HEMOGLOBIN A1C: Hgb A1c MFr Bld: 6.2 % (ref 4.6–6.5)

## 2010-07-04 LAB — BASIC METABOLIC PANEL
Calcium: 9.3 mg/dL (ref 8.4–10.5)
Creatinine, Ser: 0.7 mg/dL (ref 0.4–1.2)

## 2010-07-06 ENCOUNTER — Other Ambulatory Visit: Payer: Self-pay | Admitting: Internal Medicine

## 2010-07-06 DIAGNOSIS — E119 Type 2 diabetes mellitus without complications: Secondary | ICD-10-CM

## 2010-07-13 ENCOUNTER — Encounter: Payer: Self-pay | Admitting: Internal Medicine

## 2010-07-13 ENCOUNTER — Ambulatory Visit (INDEPENDENT_AMBULATORY_CARE_PROVIDER_SITE_OTHER): Payer: 59 | Admitting: Internal Medicine

## 2010-07-13 VITALS — BP 130/80 | HR 80 | Temp 98.5°F | Resp 16 | Ht 63.5 in | Wt 161.0 lb

## 2010-07-13 DIAGNOSIS — E119 Type 2 diabetes mellitus without complications: Secondary | ICD-10-CM

## 2010-07-13 DIAGNOSIS — Z23 Encounter for immunization: Secondary | ICD-10-CM

## 2010-07-13 DIAGNOSIS — Z2911 Encounter for prophylactic immunotherapy for respiratory syncytial virus (RSV): Secondary | ICD-10-CM

## 2010-07-13 NOTE — Progress Notes (Signed)
  Subjective:    Patient ID: Debra [name], female    DOB: Jul 18, 1945, 65 y.o.   MRN: [id number]  HPI   The patient presents for a follow-up of  chronic hypertension, chronic dyslipidemia, type 2 diabetes controlled with medicines   Review of Systems  Constitutional: Negative for chills, activity change, appetite change, fatigue and unexpected weight change.  HENT: Negative for congestion, mouth sores and sinus pressure.   Eyes: Negative for visual disturbance.  Respiratory: Negative for cough and chest tightness.   Gastrointestinal: Negative for nausea and abdominal pain.  Genitourinary: Negative for frequency, difficulty urinating and vaginal pain.  Musculoskeletal: Negative for back pain and gait problem.  Skin: Negative for pallor and rash.  Neurological: Negative for dizziness, tremors, weakness, numbness and headaches.  Psychiatric/Behavioral: Negative for confusion and sleep disturbance.       Objective:   Physical Exam  Constitutional: She appears well-developed and well-nourished. No distress.  HENT:  Head: Normocephalic.  Right Ear: External ear normal.  Left Ear: External ear normal.  Nose: Nose normal.  Mouth/Throat: Oropharynx is clear and moist.  Eyes: Conjunctivae are normal. Pupils are equal, round, and reactive to light. Right eye exhibits no discharge. Left eye exhibits no discharge.  Neck: Normal range of motion. Neck supple. No JVD present. No tracheal deviation present. No thyromegaly present.  Cardiovascular: Normal rate, regular rhythm and normal heart sounds.   Pulmonary/Chest: No stridor. No respiratory distress. She has no wheezes.  Abdominal: Soft. Bowel sounds are normal. She exhibits no distension and no mass. There is no tenderness. There is no rebound and no guarding.  Musculoskeletal: She exhibits no edema and no tenderness.  Lymphadenopathy:    She has no cervical adenopathy.  Neurological: She displays normal reflexes. No cranial nerve deficit.  She exhibits normal muscle tone. Coordination normal.  Skin: No rash noted. No erythema.  Psychiatric: She has a normal mood and affect. Her behavior is normal. Judgment and thought content normal.         Lab Results  Component Value Date   WBC 5.9 02/07/2010   HGB 15.3* 02/07/2010   HCT 44.0 02/07/2010   PLT 283.0 02/07/2010   CHOL 204* 02/07/2010   TRIG 349.0* 02/07/2010   HDL 42.80 02/07/2010   LDLDIRECT 119.8 02/07/2010   ALT 22 02/07/2010   AST 19 02/07/2010   NA 140 07/04/2010   K 4.0 07/04/2010   CL 104 07/04/2010   CREATININE 0.7 07/04/2010   BUN 14 07/04/2010   CO2 28 07/04/2010   TSH 0.92 02/07/2010   HGBA1C 6.2 07/04/2010    Assessment & Plan:

## 2010-07-15 ENCOUNTER — Encounter: Payer: Self-pay | Admitting: Internal Medicine

## 2010-07-15 NOTE — Assessment & Plan Note (Addendum)
On Rx 

## 2010-09-18 ENCOUNTER — Other Ambulatory Visit: Payer: Self-pay | Admitting: Internal Medicine

## 2010-09-18 DIAGNOSIS — Z1231 Encounter for screening mammogram for malignant neoplasm of breast: Secondary | ICD-10-CM

## 2010-09-20 ENCOUNTER — Encounter: Payer: Self-pay | Admitting: Gastroenterology

## 2010-09-28 ENCOUNTER — Other Ambulatory Visit: Payer: Self-pay | Admitting: Internal Medicine

## 2010-10-01 ENCOUNTER — Ambulatory Visit (AMBULATORY_SURGERY_CENTER): Payer: 59 | Admitting: *Deleted

## 2010-10-01 DIAGNOSIS — Z86010 Personal history of colonic polyps: Secondary | ICD-10-CM

## 2010-10-01 DIAGNOSIS — Z1211 Encounter for screening for malignant neoplasm of colon: Secondary | ICD-10-CM

## 2010-10-01 MED ORDER — SUPREP BOWEL PREP KIT 17.5-3.13-1.6 GM/177ML PO SOLN: ORAL | Status: DC

## 2010-10-01 NOTE — Progress Notes (Signed)
Patient c/o lower abd. Pressure & pain for last several months. She states it maybe due to her bladder since hysterectomy surgery. Instructions patient to notify Dr.Kaplan during her colonoscopy procedure. She understands.

## 2010-10-22 ENCOUNTER — Other Ambulatory Visit: Payer: 59 | Admitting: Gastroenterology

## 2010-10-29 ENCOUNTER — Ambulatory Visit
Admission: RE | Admit: 2010-10-29 | Discharge: 2010-10-29 | Disposition: A | Discharge status: Home / Self Care | Payer: 59 | Source: Ambulatory Visit | Attending: Internal Medicine | Admitting: Internal Medicine

## 2010-10-29 DIAGNOSIS — Z1231 Encounter for screening mammogram for malignant neoplasm of breast: Secondary | ICD-10-CM

## 2010-11-01 ENCOUNTER — Encounter: Payer: Self-pay | Admitting: Gastroenterology

## 2010-11-01 ENCOUNTER — Ambulatory Visit (AMBULATORY_SURGERY_CENTER): Payer: 59 | Admitting: Gastroenterology

## 2010-11-01 DIAGNOSIS — D126 Benign neoplasm of colon, unspecified: Secondary | ICD-10-CM

## 2010-11-01 DIAGNOSIS — Z1211 Encounter for screening for malignant neoplasm of colon: Secondary | ICD-10-CM

## 2010-11-01 DIAGNOSIS — Z86010 Personal history of colonic polyps: Secondary | ICD-10-CM

## 2010-11-01 MED ORDER — SODIUM CHLORIDE 0.9 % IV SOLN: 500.0000 mL | INTRAVENOUS | Status: DC

## 2010-11-01 NOTE — Patient Instructions (Signed)
FOLLOW DISCHARGE INSTRUCTIONS (BLUE & GREEN SHEETS)   Information given to you on polyps.

## 2010-11-02 ENCOUNTER — Telehealth: Payer: Self-pay | Admitting: *Deleted

## 2010-11-02 NOTE — Telephone Encounter (Deleted)
Follow up Call- Patient questions:  Do you have a fever, pain , or abdominal swelling? {yes no:314532} Pain Score  {NUMBERS; 0-10:5044} *  Have you tolerated food without any problems? {yes no:314532}  Have you been able to return to your normal activities? {yes no:314532}  Do you have any questions about your discharge instructions: Diet   {yes no:314532} Medications  {yes no:314532} Follow up visit  {yes no:314532}  Do you have questions or concerns about your Care? {yes no:314532}  Actions: * If pain score is 4 or above: {ACTION; [facility] ENDO PAIN >4:21563::"No action needed, pain <4."}

## 2010-11-02 NOTE — Telephone Encounter (Signed)
Message left for patient on home telephone.

## 2010-11-12 ENCOUNTER — Other Ambulatory Visit (INDEPENDENT_AMBULATORY_CARE_PROVIDER_SITE_OTHER): Payer: 59 | Admitting: *Deleted

## 2010-11-12 ENCOUNTER — Other Ambulatory Visit: Payer: Self-pay | Admitting: Internal Medicine

## 2010-11-12 DIAGNOSIS — E119 Type 2 diabetes mellitus without complications: Secondary | ICD-10-CM

## 2010-11-12 LAB — LIPID PANEL
Cholesterol: 224 mg/dL — ABNORMAL HIGH (ref 0–200)
HDL: 48.8 mg/dL (ref >39.00)
Total CHOL/HDL Ratio: 5
Triglycerides: 307 mg/dL — ABNORMAL HIGH (ref 0.0–149.0)
VLDL: 61.4 mg/dL — ABNORMAL HIGH (ref 0.0–40.0)

## 2010-11-12 LAB — MICROALBUMIN / CREATININE URINE RATIO
Creatinine,U: 102.7 mg/dL
Microalb Creat Ratio: 0.5 mg/g (ref 0.0–30.0)
Microalb, Ur: 0.5 mg/dL (ref 0.0–1.9)

## 2010-11-12 LAB — COMPREHENSIVE METABOLIC PANEL
ALT: 27 U/L (ref 0–35)
AST: 21 U/L (ref 0–37)
BUN: 13 mg/dL (ref 6–23)
Creatinine, Ser: 0.7 mg/dL (ref 0.4–1.2)
Total Bilirubin: 0.7 mg/dL (ref 0.3–1.2)

## 2010-11-12 LAB — HEMOGLOBIN A1C: Hgb A1c MFr Bld: 6.3 % (ref 4.6–6.5)

## 2010-11-14 ENCOUNTER — Inpatient Hospital Stay (INDEPENDENT_AMBULATORY_CARE_PROVIDER_SITE_OTHER)
Admission: RE | Admit: 2010-11-14 | Discharge: 2010-11-14 | Disposition: A | Discharge status: Home / Self Care | Payer: 59 | Source: Ambulatory Visit | Attending: Family Medicine | Admitting: Family Medicine

## 2010-11-14 DIAGNOSIS — J019 Acute sinusitis, unspecified: Secondary | ICD-10-CM

## 2010-11-16 ENCOUNTER — Encounter: Payer: Self-pay | Admitting: Internal Medicine

## 2010-11-16 ENCOUNTER — Ambulatory Visit (INDEPENDENT_AMBULATORY_CARE_PROVIDER_SITE_OTHER): Payer: 59 | Admitting: Internal Medicine

## 2010-11-16 VITALS — BP 100/70 | HR 64 | Temp 98.3°F | Resp 16 | Wt 161.0 lb

## 2010-11-16 DIAGNOSIS — IMO0001 Reserved for inherently not codable concepts without codable children: Secondary | ICD-10-CM

## 2010-11-16 DIAGNOSIS — E785 Hyperlipidemia, unspecified: Secondary | ICD-10-CM

## 2010-11-16 DIAGNOSIS — E119 Type 2 diabetes mellitus without complications: Secondary | ICD-10-CM

## 2010-11-16 DIAGNOSIS — I10 Essential (primary) hypertension: Secondary | ICD-10-CM

## 2010-11-16 LAB — POCT HEMOGLOBIN-HEMACUE: Hemoglobin: 14.8 g/dL (ref 12.0–15.0)

## 2010-11-16 NOTE — Progress Notes (Signed)
  Subjective:    Patient ID: Debra [name], female    DOB: 02/04/1946, 65 y.o.   MRN: [id number]  HPI  The patient presents for a follow-up of  chronic hypertension, chronic dyslipidemia, type 2 diabetes controlled with medicines    Review of Systems  Constitutional: Negative for chills, activity change, appetite change, fatigue and unexpected weight change.  HENT: Negative for congestion, mouth sores and sinus pressure.   Eyes: Negative for visual disturbance.  Respiratory: Negative for cough and chest tightness.   Gastrointestinal: Negative for nausea and abdominal pain.  Genitourinary: Negative for frequency, difficulty urinating and vaginal pain.  Musculoskeletal: Negative for back pain and gait problem.  Skin: Negative for pallor and rash.  Neurological: Negative for dizziness, tremors, weakness, numbness and headaches.  Psychiatric/Behavioral: Negative for confusion and sleep disturbance.       Objective:   Physical Exam  Constitutional: She appears well-developed and well-nourished. No distress.  HENT:  Head: Normocephalic.  Right Ear: External ear normal.  Left Ear: External ear normal.  Nose: Nose normal.  Mouth/Throat: Oropharynx is clear and moist.  Eyes: Conjunctivae are normal. Pupils are equal, round, and reactive to light. Right eye exhibits no discharge. Left eye exhibits no discharge.  Neck: Normal range of motion. Neck supple. No JVD present. No tracheal deviation present. No thyromegaly present.  Cardiovascular: Normal rate, regular rhythm and normal heart sounds.   Pulmonary/Chest: No stridor. No respiratory distress. She has no wheezes.  Abdominal: Soft. Bowel sounds are normal. She exhibits no distension and no mass. There is no tenderness. There is no rebound and no guarding.  Musculoskeletal: She exhibits no edema and no tenderness.  Lymphadenopathy:    She has no cervical adenopathy.  Neurological: She displays normal reflexes. No cranial nerve deficit.  She exhibits normal muscle tone. Coordination normal.  Skin: No rash noted. No erythema.  Psychiatric: She has a normal mood and affect. Her behavior is normal. Judgment and thought content normal.     Lab Results  Component Value Date   WBC 5.9 02/07/2010   HGB 15.3* 02/07/2010   HCT 44.0 02/07/2010   PLT 283.0 02/07/2010   GLUCOSE 126* 11/12/2010   CHOL 224* 11/12/2010   TRIG 307.0* 11/12/2010   HDL 48.80 11/12/2010   LDLDIRECT 134.2 11/12/2010   LDLCALC 98 04/18/2008   ALT 27 11/12/2010   AST 21 11/12/2010   NA 140 11/12/2010   K 4.3 11/12/2010   CL 103 11/12/2010   CREATININE 0.7 11/12/2010   BUN 13 11/12/2010   CO2 27 11/12/2010   TSH 0.88 11/12/2010   HGBA1C 6.3 11/12/2010   MICROALBUR 0.5 11/12/2010        Assessment & Plan:

## 2010-11-16 NOTE — Assessment & Plan Note (Signed)
Continue with current prescription therapy as reflected on the Med list.  

## 2010-11-16 NOTE — Assessment & Plan Note (Signed)
She has been able to tolerate qod Lipitor regimen w/o myalgias

## 2010-11-26 LAB — POCT RAPID STREP A: Streptococcus, Group A Screen (Direct): NEGATIVE

## 2010-11-27 ENCOUNTER — Other Ambulatory Visit: Payer: Self-pay | Admitting: Dermatology

## 2010-12-13 ENCOUNTER — Other Ambulatory Visit: Payer: Self-pay | Admitting: Dermatology

## 2011-02-11 ENCOUNTER — Telehealth: Payer: Self-pay | Admitting: *Deleted

## 2011-02-11 NOTE — Telephone Encounter (Signed)
Last day of work: retiring & moving--request for [facility] & lipitor refills, also a lower tier alternative for Nexium [cost wise].

## 2011-02-13 MED ORDER — OMEPRAZOLE 40 MG PO CPDR: 40.0000 mg | DELAYED_RELEASE_CAPSULE | Freq: Every day | ORAL | Status: DC

## 2011-02-13 MED ORDER — DILTIAZEM HCL ER COATED BEADS 180 MG PO CP24: 180.0000 mg | ORAL_CAPSULE | Freq: Every day | ORAL | Status: DC

## 2011-02-13 NOTE — Telephone Encounter (Signed)
Pls mail Rx - diltiazem and Omeprazole Thx

## 2011-02-14 ENCOUNTER — Other Ambulatory Visit: Payer: Self-pay | Admitting: *Deleted

## 2011-02-14 MED ORDER — DILTIAZEM HCL ER COATED BEADS 180 MG PO CP24: 180.0000 mg | ORAL_CAPSULE | Freq: Every day | ORAL | Status: AC

## 2011-02-14 MED ORDER — OMEPRAZOLE 40 MG PO CPDR: 40.0000 mg | DELAYED_RELEASE_CAPSULE | Freq: Every day | ORAL | Status: AC

## 2011-02-14 MED ORDER — ATORVASTATIN CALCIUM 20 MG PO TABS
20.0000 mg | ORAL_TABLET | ORAL | Status: AC
Start: 2011-02-14 — End: ?

## 2011-02-14 NOTE — Telephone Encounter (Signed)
Done. Thx.

## 2011-02-14 NOTE — Telephone Encounter (Signed)
Pt informed rx's ready for pickup via VM. Rx upfront in cabinet ready for pickup.

## 2011-02-14 NOTE — Telephone Encounter (Signed)
Rxs were faxed to pharmacy.

## 2011-02-14 NOTE — Telephone Encounter (Signed)
Pt wants rx's for Lipitor, Diltiazem and Omeprazole printed so that she can pick up and take to pharmacy since she is moving out of town (see 02/11/2011 phone note). Rx's printed, awaiting MD's signature.

## 2012-04-07 ENCOUNTER — Other Ambulatory Visit: Payer: Self-pay | Admitting: Internal Medicine

## 2012-07-29 ENCOUNTER — Other Ambulatory Visit: Payer: Self-pay | Admitting: Internal Medicine

## 2014-01-11 ENCOUNTER — Telehealth: Payer: Self-pay | Admitting: *Deleted

## 2014-01-11 NOTE — Telephone Encounter (Signed)
Left msg on triage requesting to know last pneumonia & tetanus, and shingle was given. Called pt gave her dates on when immunizations was given...Johny Chess

## 2014-06-29 ENCOUNTER — Encounter: Payer: Self-pay | Admitting: Gastroenterology

## 2020-11-28 ENCOUNTER — Encounter: Payer: Self-pay | Admitting: Gastroenterology
